# Patient Record
Sex: Male | Born: 1965 | Race: White | Hispanic: No | Marital: Married | Smoking: Former smoker
Health system: Southern US, Community
[De-identification: ages and names within clinical notes are randomized; demographics above are authoritative.]

## PROBLEM LIST (undated history)

## (undated) DIAGNOSIS — M1990 Unspecified osteoarthritis, unspecified site: Secondary | ICD-10-CM

## (undated) DIAGNOSIS — R011 Cardiac murmur, unspecified: Secondary | ICD-10-CM

## (undated) DIAGNOSIS — F419 Anxiety disorder, unspecified: Secondary | ICD-10-CM

## (undated) DIAGNOSIS — Z973 Presence of spectacles and contact lenses: Secondary | ICD-10-CM

## (undated) DIAGNOSIS — R12 Heartburn: Secondary | ICD-10-CM

## (undated) DIAGNOSIS — K219 Gastro-esophageal reflux disease without esophagitis: Secondary | ICD-10-CM

## (undated) DIAGNOSIS — E785 Hyperlipidemia, unspecified: Secondary | ICD-10-CM

## (undated) HISTORY — PX: COLONOSCOPY: SHX174

## (undated) HISTORY — PX: CHOLECYSTECTOMY: SHX55

## (undated) HISTORY — PX: NASAL SINUS SURGERY: SHX719

## (undated) HISTORY — DX: Hyperlipidemia, unspecified: E78.5

## (undated) HISTORY — DX: Anxiety disorder, unspecified: F41.9

---

## 2014-05-09 LAB — LIPID PANEL
Cholesterol: 245 mg/dL — AB (ref 0–200)
HDL: 49 mg/dL (ref 35–70)
LDL CALC: 152 mg/dL

## 2014-05-09 LAB — PSA: PSA: 1

## 2014-06-08 ENCOUNTER — Ambulatory Visit: Payer: Self-pay | Admitting: Gastroenterology

## 2014-06-11 LAB — HM COLONOSCOPY: HM Colonoscopy: ABNORMAL — AB

## 2014-06-20 ENCOUNTER — Ambulatory Visit: Payer: Self-pay | Admitting: Internal Medicine

## 2014-06-25 ENCOUNTER — Ambulatory Visit: Payer: Self-pay | Admitting: Gastroenterology

## 2014-07-10 ENCOUNTER — Ambulatory Visit
Admit: 2014-07-10 | Disposition: A | Discharge status: Home / Self Care | Payer: Self-pay | Attending: Internal Medicine | Admitting: Internal Medicine

## 2014-07-22 DIAGNOSIS — Z85048 Personal history of other malignant neoplasm of rectum, rectosigmoid junction, and anus: Secondary | ICD-10-CM | POA: Insufficient documentation

## 2014-08-03 ENCOUNTER — Ambulatory Visit: Payer: Self-pay

## 2015-01-17 ENCOUNTER — Telehealth: Payer: Self-pay

## 2015-01-17 NOTE — Telephone Encounter (Signed)
  Oncology Nurse Navigator Documentation    Navigator Encounter Type: Telephone (01/17/15 1600)                      Time Spent with Patient: 15 (01/17/15 1600)   Received call from pt. Having follow up at [facility] with Dr Ronita Hipps next wee. Asking if anything at that visit can be done local. Unknown what will be done at that visit. Instructed if he can be made aware we could see if anything could be done local.

## 2015-03-28 ENCOUNTER — Encounter: Payer: Self-pay | Admitting: Family Medicine

## 2015-03-28 ENCOUNTER — Ambulatory Visit (INDEPENDENT_AMBULATORY_CARE_PROVIDER_SITE_OTHER): Payer: PRIVATE HEALTH INSURANCE | Admitting: Family Medicine

## 2015-03-28 VITALS — BP 112/80 | HR 82 | Temp 98.0°F | Resp 18 | Ht 68.0 in | Wt 178.0 lb

## 2015-03-28 DIAGNOSIS — F411 Generalized anxiety disorder: Secondary | ICD-10-CM | POA: Diagnosis not present

## 2015-03-28 DIAGNOSIS — E785 Hyperlipidemia, unspecified: Secondary | ICD-10-CM | POA: Diagnosis not present

## 2015-03-28 MED ORDER — CLONAZEPAM 0.5 MG PO TABS: 0.5000 mg | ORAL_TABLET | Freq: Three times a day (TID) | ORAL | Status: DC

## 2015-03-28 NOTE — Progress Notes (Signed)
Name: Brett Gonzales   MRN: [id number]    DOB: 1965/07/01   Date:03/28/2015       Progress Note  Subjective  Chief Complaint  Chief Complaint  Patient presents with  . Anxiety    3 month follow up    HPI  Anxiety history of present illness  Long-standing history of anxiety. Skin on for many years. He's been seen by psychologist in conjunction past. Is currently on clonazepam 0.5 mg 3 times a day and daily at bedtime which is working effectively for him. Symptoms consist mainly of racing thoughts sweaty palms palpitations  Hyperlipidemia  Patient has a history of hyperlipidemia for several years.  Current medical regimen consist of diet alone .  Compliance is usually good .  Diet and exercise are currently followed usually regularly .  Risk factors for cardiovascular disease include hyperlipidemia . Marland Kitchen   There have been no side effects from the medication.    Past Medical History  Diagnosis Date  . Anxiety   . Hyperlipidemia     Social History  Substance Use Topics  . Smoking status: Former Research scientist (life sciences)  . Smokeless tobacco: Not on file  . Alcohol Use: 0.0 oz/week    0 Standard drinks or equivalent per week     Comment: occasional     Current outpatient prescriptions:  .  clonazePAM (KLONOPIN) 0.5 MG tablet, , Disp: , Rfl:   Allergies  Allergen Reactions  . Other Hives  . Penicillin G Hives  . Androgens Rash    Tachycardia, hot flashes, flushing  . Nsaids Rash    Bleeding thin blood.    Review of Systems  Constitutional: Negative for fever, chills and weight loss.  HENT: Negative for congestion, hearing loss, sore throat and tinnitus.   Eyes: Negative for blurred vision, double vision and redness.  Respiratory: Negative for cough, hemoptysis and shortness of breath.   Cardiovascular: Negative for chest pain, palpitations, orthopnea, claudication and leg swelling.  Gastrointestinal: Negative for heartburn, nausea, vomiting, diarrhea, constipation and blood in stool.   Genitourinary: Negative for dysuria, urgency, frequency and hematuria.  Musculoskeletal: Negative for myalgias, back pain, joint pain, falls and neck pain.  Skin: Negative for itching.  Neurological: Negative for dizziness, tingling, tremors, focal weakness, seizures, loss of consciousness, weakness and headaches.  Endo/Heme/Allergies: Does not bruise/bleed easily.  Psychiatric/Behavioral: Negative for depression and substance abuse. The patient is nervous/anxious. The patient does not have insomnia.      Objective  Filed Vitals:   03/28/15 0753  BP: 112/80  Pulse: 82  Temp: 98 F (36.7 C)  TempSrc: Oral  Resp: 18  Height: 5\' 8"  (1.727 m)  Weight: 178 lb (80.74 kg)  SpO2: 95%     Physical Exam  Constitutional: He is oriented to person, place, and time and well-developed, well-nourished, and in no distress.  HENT:  Head: Normocephalic.  Eyes: EOM are normal. Pupils are equal, round, and reactive to light.  Neck: Normal range of motion. Neck supple. No thyromegaly present.  Cardiovascular: Normal rate, regular rhythm and normal heart sounds.   No murmur heard. Pulmonary/Chest: Effort normal and breath sounds normal. No respiratory distress. He has no wheezes.  Abdominal: Soft. Bowel sounds are normal.  Musculoskeletal: Normal range of motion. He exhibits no edema.  Lymphadenopathy:    He has no cervical adenopathy.  Neurological: He is alert and oriented to person, place, and time. No cranial nerve deficit. Gait normal. Coordination normal.  Skin: Skin is warm and dry. No  rash noted.  Psychiatric: Judgment normal.  Anxious and slightly loquacious.      Assessment & Plan  1. Generalized anxiety disorder Stable - clonazePAM (KLONOPIN) 0.5 MG tablet; Take 1 tablet (0.5 mg total) by mouth 4 (four) times daily -  with meals and at bedtime.  Dispense: 0.12 tablet; Refill: 2 - Comprehensive Metabolic Panel (CMET) - TSH  2. Hyperlipidemia labs - Lipid  panel

## 2015-04-16 LAB — COMPREHENSIVE METABOLIC PANEL
ALT: 36 IU/L (ref 0–44)
AST: 27 IU/L (ref 0–40)
Albumin/Globulin Ratio: 1.7 (ref 1.1–2.5)
Albumin: 4.7 g/dL (ref 3.5–5.5)
Alkaline Phosphatase: 34 IU/L — ABNORMAL LOW (ref 39–117)
BILIRUBIN TOTAL: 0.5 mg/dL (ref 0.0–1.2)
BUN/Creatinine Ratio: 11 (ref 9–20)
BUN: 10 mg/dL (ref 6–24)
CALCIUM: 9.7 mg/dL (ref 8.7–10.2)
CHLORIDE: 101 mmol/L (ref 97–106)
CO2: 25 mmol/L (ref 18–29)
Creatinine, Ser: 0.88 mg/dL (ref 0.76–1.27)
GFR, EST AFRICAN AMERICAN: 115 mL/min/{1.73_m2} (ref >59)
GFR, EST NON AFRICAN AMERICAN: 99 mL/min/{1.73_m2} (ref >59)
GLUCOSE: 85 mg/dL (ref 65–99)
Globulin, Total: 2.8 g/dL (ref 1.5–4.5)
Potassium: 5 mmol/L (ref 3.5–5.2)
Sodium: 142 mmol/L (ref 136–144)
TOTAL PROTEIN: 7.5 g/dL (ref 6.0–8.5)

## 2015-04-16 LAB — LIPID PANEL
Chol/HDL Ratio: 5.8 ratio units — ABNORMAL HIGH (ref 0.0–5.0)
Cholesterol, Total: 236 mg/dL — ABNORMAL HIGH (ref 100–199)
HDL: 41 mg/dL (ref >39)
LDL Calculated: 135 mg/dL — ABNORMAL HIGH (ref 0–99)
Triglycerides: 302 mg/dL — ABNORMAL HIGH (ref 0–149)
VLDL CHOLESTEROL CAL: 60 mg/dL — AB (ref 5–40)

## 2015-04-16 LAB — TSH: TSH: 2.12 u[IU]/mL (ref 0.450–4.500)

## 2015-04-22 ENCOUNTER — Telehealth: Payer: Self-pay | Admitting: Family Medicine

## 2015-04-22 NOTE — Telephone Encounter (Signed)
Pt would like lab results.  

## 2015-04-23 ENCOUNTER — Telehealth: Payer: Self-pay | Admitting: Family Medicine

## 2015-04-23 MED ORDER — ATORVASTATIN CALCIUM 20 MG PO TABS: 20.0000 mg | ORAL_TABLET | Freq: Every day | ORAL | Status: DC

## 2015-04-23 NOTE — Telephone Encounter (Signed)
Requesting most recent lab work to be faxed to him. Please send to [facility] (F) [phone]

## 2015-04-23 NOTE — Telephone Encounter (Signed)
Spoke with pt and gave results. Printed out copy for pt to pick up.

## 2015-04-24 ENCOUNTER — Telehealth: Payer: Self-pay | Admitting: Emergency Medicine

## 2015-04-24 NOTE — Telephone Encounter (Signed)
Information has been faxed per patient request

## 2015-04-24 NOTE — Telephone Encounter (Signed)
Script updated at pharmacy. Labwork to be picked up

## 2015-05-01 ENCOUNTER — Telehealth: Payer: Self-pay | Admitting: Family Medicine

## 2015-05-01 NOTE — Telephone Encounter (Signed)
Patient stated that he his having a reaction to the Liptor medication.  Patient stated it is causing his muscles to ache and is wanting to know if [facility] [place]

## 2015-05-15 MED ORDER — ROSUVASTATIN CALCIUM 5 MG PO TABS: 5.0000 mg | ORAL_TABLET | Freq: Every day | ORAL | Status: DC

## 2015-05-15 NOTE — Telephone Encounter (Signed)
Script called in for Crestor 5.

## 2015-07-01 ENCOUNTER — Ambulatory Visit: Payer: PRIVATE HEALTH INSURANCE | Admitting: Family Medicine

## 2015-07-10 ENCOUNTER — Telehealth: Payer: Self-pay | Admitting: *Deleted

## 2015-07-10 NOTE — Telephone Encounter (Signed)
I have areminder not in my calendar to call pt. He was seen in 2016 and was sent to [facility] for sec opinion and we got a message back stating that he can come back to cancer center once a year and have labs and CT scan.  [facility] had discussed his case in tumor conference and rec: no reexcision just surveillance through blood work and [place].  If he was interested in coming here or if he had been following up with [facility] just call me and let me know and I will handle it either way pt wanted to do. I also left the message that Dr. Ma Hillock was no longer here at clinic but would be glad to set him up with another doctor and Dr. Rogue Bussing is glad to assist.

## 2015-07-17 ENCOUNTER — Telehealth: Payer: Self-pay | Admitting: *Deleted

## 2015-07-17 NOTE — Telephone Encounter (Signed)
No answer on cell phone. Called again and left message that if pt was interested in coming here for labs and ct scan to f/u with him, or if he was going to [facility] to f/u or he had chose not to do either.  If he could let me know and I could arrange appts for him or enter in a f/u note to make sure I am taking of any needs he may have at cancer center or if services are not need per the pt decision. Asked if he could call me back.

## 2015-07-29 ENCOUNTER — Telehealth: Payer: Self-pay | Admitting: Family Medicine

## 2015-07-29 NOTE — Telephone Encounter (Signed)
Patient is requesting a refill on Clonazepam 0.5 mg ([facility]) and Rosuvastatin 5 mg (this medication needs to be sent to [facility] on [address] because it's cheaper).  Please call patient

## 2015-07-30 ENCOUNTER — Other Ambulatory Visit: Payer: Self-pay | Admitting: Family Medicine

## 2015-07-30 ENCOUNTER — Encounter: Payer: Self-pay | Admitting: *Deleted

## 2015-07-30 DIAGNOSIS — F411 Generalized anxiety disorder: Secondary | ICD-10-CM

## 2015-07-30 MED ORDER — CLONAZEPAM 0.5 MG PO TABS: 0.5000 mg | ORAL_TABLET | Freq: Three times a day (TID) | ORAL | Status: DC | PRN

## 2015-07-30 NOTE — Progress Notes (Signed)
I kept a copy of an email from [employer] navigator in regards to above pt.  The message was from Carlisle Cater NP for [facility] GI surgery.  The message was that pt was seen at [facility] and discussed at tumor board and do not rec: re-excision instead just surveillance.  Dr. Ronita Hipps will see him in June 2016 but pt will needa yearly Ct and CEA.  His last scan was end of feb 2016.  I entered a reminder note in outlook to contact him about if he would like to come here and have ct scan which was rec: by [facility].  I also left on my message that Dr. Ma Hillock had left the practice but there are other doctors here that would be happy to care for him.  I have left 2 messages on his cell phone in which it states that this is [place] leave me your message.  I am filing this email printed off and if pt calls back would be happy to provide service for him.

## 2015-08-01 ENCOUNTER — Ambulatory Visit: Payer: PRIVATE HEALTH INSURANCE | Admitting: Family Medicine

## 2015-08-22 ENCOUNTER — Other Ambulatory Visit: Payer: Self-pay

## 2015-08-22 DIAGNOSIS — F411 Generalized anxiety disorder: Secondary | ICD-10-CM

## 2015-08-22 MED ORDER — CLONAZEPAM 0.5 MG PO TABS: 0.5000 mg | ORAL_TABLET | Freq: Three times a day (TID) | ORAL | Status: DC | PRN

## 2015-08-26 ENCOUNTER — Encounter: Payer: Self-pay | Admitting: Family Medicine

## 2015-08-26 ENCOUNTER — Ambulatory Visit (INDEPENDENT_AMBULATORY_CARE_PROVIDER_SITE_OTHER): Payer: PRIVATE HEALTH INSURANCE | Admitting: Family Medicine

## 2015-08-26 VITALS — BP 122/84 | HR 90 | Temp 98.0°F | Resp 18 | Wt 177.0 lb

## 2015-08-26 DIAGNOSIS — Z8719 Personal history of other diseases of the digestive system: Secondary | ICD-10-CM | POA: Diagnosis not present

## 2015-08-26 DIAGNOSIS — F411 Generalized anxiety disorder: Secondary | ICD-10-CM

## 2015-08-26 DIAGNOSIS — E785 Hyperlipidemia, unspecified: Secondary | ICD-10-CM

## 2015-08-26 DIAGNOSIS — Z85048 Personal history of other malignant neoplasm of rectum, rectosigmoid junction, and anus: Secondary | ICD-10-CM | POA: Diagnosis not present

## 2015-08-26 MED ORDER — ASPIRIN EC 81 MG PO TBEC: 81.0000 mg | DELAYED_RELEASE_TABLET | Freq: Every day | ORAL | Status: DC

## 2015-08-26 NOTE — Progress Notes (Signed)
Name: Brett Gonzales   MRN: [id number]    DOB: 1965-07-02   Date:08/26/2015       Progress Note  Subjective  Chief Complaint  Chief Complaint  Patient presents with  . Medication Refill    Klonopin 0.5mg   . Immunizations    TDAP    HPI  Hyperlipidemia: he has been out of Crestor, because his PCP has been on sick leave. Last labs in 04/2015, we will recheck labs today before resuming medication. He denies chest pain or decrease in exercise tolerance. He is very active. He is not on aspirin. Discussed cardiovascular protection and he will try  Anxiety Disorder: he has been taking on Klonopin for many years. He states he was started on Ativan by Dr. Janae Bridgeman many years ago. He states he tried many medications without success in the past. Discussed SSRI and Alprazolam XR, but he would like to hold off until Dr. Rutherford Nail comes back.   History of GI bleed: many years ago, he was taking NSAID's daily and was told to stop at this time.   History of Rectal cancer: it was caught early by a colonoscopy, last one in 06/2015 - he is doing well now, repeat in 3 years. He denies change in bowel movement or rectal bleeding.   Patient Active Problem List   Diagnosis Date Noted  . History of GI bleed 08/26/2015  . Anxiety disorder 03/28/2015  . Hyperlipidemia 03/28/2015  . History of rectal cancer 07/22/2014    History reviewed. No pertinent past surgical history.  History reviewed. No pertinent family history.  Social History   Social History  . Marital Status: Married    Spouse Name: N/A  . Number of Children: N/A  . Years of Education: N/A   Occupational History  . Not on file.   Social History Main Topics  . Smoking status: Former Research scientist (life sciences)  . Smokeless tobacco: Not on file  . Alcohol Use: 0.0 oz/week    0 Standard drinks or equivalent per week     Comment: occasional  . Drug Use: No  . Sexual Activity:    Partners: Female   Other Topics Concern  . Not on file   Social  History Narrative     Current outpatient prescriptions:  .  Ascorbic Acid (VITAMIN C PO), Take 2,000 mg by mouth daily., Disp: , Rfl:  .  b complex vitamins capsule, Take 1 capsule by mouth daily., Disp: , Rfl:  .  clonazePAM (KLONOPIN) 0.5 MG tablet, Take 1 tablet (0.5 mg total) by mouth 3 (three) times daily as needed for anxiety. Patient taking 4 times a day. Verified with pharmacy TID and 1 at bedtime, Disp: 120 tablet, Rfl: 0 .  co-enzyme Q-10 30 MG capsule, Take 30 mg by mouth 3 (three) times daily., Disp: , Rfl:  .  milk thistle 175 MG tablet, Take 175 mg by mouth daily., Disp: , Rfl:  .  Misc Natural Products (GINSENG COMPLEX PO), Take by mouth., Disp: , Rfl:  .  Multiple Vitamin (MULTI-VITAMINS) TABS, Take by mouth., Disp: , Rfl:   Allergies  Allergen Reactions  . Clarithromycin Hives  . Other Hives  . Penicillin G Hives  . Testosterone Other (See Comments)    Tachycardia, hot flashes, flushing  . Androgens Rash    Tachycardia, hot flashes, flushing  . Nsaids Rash    Bleeding thin blood.     ROS  Ten systems reviewed and is negative except as mentioned in HPI  Objective  Filed Vitals:   08/26/15 0855  BP: 122/84  Pulse: 90  Temp: 98 F (36.7 C)  TempSrc: Oral  Resp: 18  Weight: 177 lb (80.287 kg)  SpO2: 97%    Body mass index is 26.92 kg/(m^2).  Physical Exam  Constitutional: Patient appears well-developed and well-nourished.  No distress.  HEENT: head atraumatic, normocephalic, pupils equal and reactive to light, neck supple, throat within normal limits Cardiovascular: Normal rate, regular rhythm and normal heart sounds.  No murmur heard. No BLE edema. Pulmonary/Chest: Effort normal and breath sounds normal. No respiratory distress. Abdominal: Soft.  There is no tenderness. Psychiatric: Patient has a normal mood and affect. behavior is normal. Judgment and thought content normal.  PHQ2/9: Depression screen [facility] 2/9 08/26/2015 03/28/2015  Decreased  Interest 0 0  Down, Depressed, Hopeless 0 0  PHQ - 2 Score 0 0    Fall Risk: Fall Risk  08/26/2015 03/28/2015  Falls in the past year? No No    Functional Status Survey: Is the patient deaf or have difficulty hearing?: No Does the patient have difficulty seeing, even when wearing glasses/contacts?: No Does the patient have difficulty concentrating, remembering, or making decisions?: No Does the patient have difficulty walking or climbing stairs?: No Does the patient have difficulty dressing or bathing?: No Does the patient have difficulty doing errands alone such as visiting a doctor's office or shopping?: No    Assessment & Plan  1. Hyperlipidemia  - Lipid panel  2. History of rectal cancer  Doing well, last colonoscopy was clear  3. Generalized anxiety disorder  Discussed options, he will think about it and discuss with Dr. Rutherford Nail  4. History of GI bleed  Discussed need for baby aspirin and he will try. Advised to stop if he has any GI discomfort or bleed, can even try taking every other day to begin

## 2015-09-10 LAB — LIPID PANEL
CHOL/HDL RATIO: 5.7 ratio — AB (ref 0.0–5.0)
Cholesterol, Total: 212 mg/dL — ABNORMAL HIGH (ref 100–199)
HDL: 37 mg/dL — ABNORMAL LOW (ref >39)
LDL CALC: 125 mg/dL — AB (ref 0–99)
Triglycerides: 251 mg/dL — ABNORMAL HIGH (ref 0–149)
VLDL Cholesterol Cal: 50 mg/dL — ABNORMAL HIGH (ref 5–40)

## 2015-09-17 ENCOUNTER — Ambulatory Visit: Payer: PRIVATE HEALTH INSURANCE | Admitting: Family Medicine

## 2015-09-23 ENCOUNTER — Other Ambulatory Visit: Payer: Self-pay | Admitting: Family Medicine

## 2015-09-26 ENCOUNTER — Ambulatory Visit (INDEPENDENT_AMBULATORY_CARE_PROVIDER_SITE_OTHER): Payer: PRIVATE HEALTH INSURANCE | Admitting: Family Medicine

## 2015-09-26 ENCOUNTER — Encounter: Payer: Self-pay | Admitting: Family Medicine

## 2015-09-26 VITALS — BP 116/76 | HR 89 | Temp 97.6°F | Resp 16 | Wt 169.9 lb

## 2015-09-26 DIAGNOSIS — Z85048 Personal history of other malignant neoplasm of rectum, rectosigmoid junction, and anus: Secondary | ICD-10-CM | POA: Diagnosis not present

## 2015-09-26 DIAGNOSIS — F411 Generalized anxiety disorder: Secondary | ICD-10-CM

## 2015-09-26 DIAGNOSIS — E785 Hyperlipidemia, unspecified: Secondary | ICD-10-CM

## 2015-09-26 DIAGNOSIS — R634 Abnormal weight loss: Secondary | ICD-10-CM

## 2015-09-26 MED ORDER — ASPIRIN 81 MG PO TBEC: 81.0000 mg | DELAYED_RELEASE_TABLET | Freq: Every day | ORAL | Status: DC

## 2015-09-26 MED ORDER — CLONAZEPAM 0.5 MG PO TABS: 0.5000 mg | ORAL_TABLET | Freq: Every day | ORAL | Status: DC | PRN

## 2015-09-26 MED ORDER — ALPRAZOLAM ER 0.5 MG PO TB24: 0.5000 mg | ORAL_TABLET | Freq: Every day | ORAL | Status: DC

## 2015-09-26 NOTE — Progress Notes (Signed)
Name: Brett Gonzales   MRN: [id number]    DOB: 02/22/1966   Date:09/26/2015       Progress Note  Subjective  Chief Complaint  Chief Complaint  Patient presents with  . Medication Refill    clonazepam    HPI  Hyperlipidemia: he has been out of Crestor, because his PCP has been on sick leave. Last labs in 04/2015, we rechecked labs and ASCVD was not high so we will stay off statin therapy. Based on the results of lipid panel his cardiovascular risk factor ( using [facility] )  in the next 10 years is : 5.2 % and does not need statin therapy at this time.Marland Kitchen He denies chest pain or decrease in exercise tolerance. He is very active.  Anxiety Disorder: he has been taking on Klonopin for many years. He states he was started on Ativan by Dr. Janae Bridgeman many years ago. He states he tried many medications without success in the past. Discussed SSRI and Alprazolam XR. He is back today, Dr. Rutherford Nail will not be back until July ( possibly ) he is willing to try Alprazolam XR and if it does not work we will refer him to another provider that feels comfortable prescribing 120 pills monthly.   History of GI bleed: many years ago, he was taking NSAID's daily and was told to stop at this time.   History of Rectal cancer: it was caught early by a colonoscopy, last one in 06/2015 - he is doing well now, repeat in 3 years. He denies change in bowel movement or rectal bleeding.   Weight loss: he has lost 8 lbs over the past month, he states he is trying to lose weight. He has increased physical activity, has a FitBit. He has changed his diet, eat more fruit and protein, avoid sweets. He states he wants to go down to 165 lbs. Discussed checking labs to make sure no other reason for weight loss, but he would like to hold off for now. He also had a mild gastroenteritis a few weeks ago and lost some weight at the time. He is also taking cambondia garcinia from [facility] and has caused him to have more bowel movements. He  denies blood in stools, no mucus, no nocturnal bowel movements   Patient Active Problem List   Diagnosis Date Noted  . History of GI bleed 08/26/2015  . GAD (generalized anxiety disorder) 03/28/2015  . Hyperlipidemia 03/28/2015  . History of rectal cancer 07/22/2014    Past Surgical History  Procedure Laterality Date  . Cholecystectomy    . Nasal sinus surgery Bilateral     Family History  Problem Relation Age of Onset  . Cancer Mother   . Diabetes Father     Social History   Social History  . Marital Status: Married    Spouse Name: N/A  . Number of Children: N/A  . Years of Education: N/A   Occupational History  . Not on file.   Social History Main Topics  . Smoking status: Former Research scientist (life sciences)  . Smokeless tobacco: Not on file  . Alcohol Use: 0.0 oz/week    0 Standard drinks or equivalent per week     Comment: occasional  . Drug Use: No  . Sexual Activity:    Partners: Female   Other Topics Concern  . Not on file   Social History Narrative     Current outpatient prescriptions:  .  ALPRAZolam (ALPRAZOLAM XR) 0.5 MG 24 hr tablet, Take 1  tablet (0.5 mg total) by mouth daily., Disp: 30 tablet, Rfl: 0 .  Ascorbic Acid (VITAMIN C PO), Take 2,000 mg by mouth daily., Disp: , Rfl:  .  aspirin 81 MG EC tablet, TAKE 1 TABLET BY MOUTH EVERY DAY, Disp: 30 tablet, Rfl: 0 .  b complex vitamins capsule, Take 1 capsule by mouth daily., Disp: , Rfl:  .  clonazePAM (KLONOPIN) 0.5 MG tablet, Take 1 tablet (0.5 mg total) by mouth daily as needed for anxiety. Patient taking 4 times a day. Verified with pharmacy TID and 1 at bedtime, Disp: 30 tablet, Rfl: 0 .  co-enzyme Q-10 30 MG capsule, Take 30 mg by mouth 3 (three) times daily., Disp: , Rfl:  .  milk thistle 175 MG tablet, Take 175 mg by mouth daily., Disp: , Rfl:  .  Misc Natural Products (GINSENG COMPLEX PO), Take by mouth., Disp: , Rfl:  .  Multiple Vitamin (MULTI-VITAMINS) TABS, Take by mouth., Disp: , Rfl:   Allergies   Allergen Reactions  . Clarithromycin Hives  . Other Hives  . Penicillin G Hives  . Testosterone Other (See Comments)    Tachycardia, hot flashes, flushing  . Androgens Rash    Tachycardia, hot flashes, flushing  . Nsaids Other (See Comments)    History of GI bleed      ROS  Constitutional: Negative for fever , positive for  weight change.  Respiratory: Negative for cough and shortness of breath.   Cardiovascular: Negative for chest pain or palpitations.  Gastrointestinal: Negative for abdominal pain, no bowel changes.  Musculoskeletal: Negative for gait problem or joint swelling.  Skin: Negative for rash.  Neurological: Negative for dizziness or headache.  No other specific complaints in a complete review of systems (except as listed in HPI above).  Objective  Filed Vitals:   09/26/15 0837  BP: 116/76  Pulse: 89  Temp: 97.6 F (36.4 C)  TempSrc: Oral  Resp: 16  Weight: 169 lb 14.4 oz (77.066 kg)  SpO2: 98%    Body mass index is 25.84 kg/(m^2).  Physical Exam  Constitutional: Patient appears well-developed and well-nourished. No distress.  HEENT: head atraumatic, normocephalic, pupils equal and reactive to light,  neck supple, throat within normal limits Cardiovascular: Normal rate, regular rhythm and normal heart sounds.  No murmur heard. No BLE edema. Pulmonary/Chest: Effort normal and breath sounds normal. No respiratory distress. Abdominal: Soft.  There is no tenderness. Psychiatric: Patient has a normal mood and affect. behavior is normal. Judgment and thought content normal.  Recent Results (from the past 2160 hour(s))  Lipid panel     Status: Abnormal   Collection Time: 09/09/15  8:19 AM  Result Value Ref Range   Cholesterol, Total 212 (H) 100 - 199 mg/dL   Triglycerides 251 (H) 0 - 149 mg/dL   HDL 37 (L) >39 mg/dL   VLDL Cholesterol Cal 50 (H) 5 - 40 mg/dL   LDL Calculated 125 (H) 0 - 99 mg/dL   Chol/HDL Ratio 5.7 (H) 0.0 - 5.0 ratio units     Comment:                                   T. Chol/HDL Ratio  Men  Women                               1/2 Avg.Risk  3.4    3.3                                   Avg.Risk  5.0    4.4                                2X Avg.Risk  9.6    7.1                                3X Avg.Risk 23.4   11.0       PHQ2/9: Depression screen [facility] 2/9 09/26/2015 08/26/2015 03/28/2015  Decreased Interest 0 0 0  Down, Depressed, Hopeless 0 0 0  PHQ - 2 Score 0 0 0     Fall Risk: Fall Risk  09/26/2015 08/26/2015 03/28/2015  Falls in the past year? No No No     Functional Status Survey: Is the patient deaf or have difficulty hearing?: No Does the patient have difficulty seeing, even when wearing glasses/contacts?: No Does the patient have difficulty concentrating, remembering, or making decisions?: No Does the patient have difficulty walking or climbing stairs?: No Does the patient have difficulty dressing or bathing?: No Does the patient have difficulty doing errands alone such as visiting a doctor's office or shopping?: No    Assessment & Plan  1. Generalized anxiety disorder  - clonazePAM (KLONOPIN) 0.5 MG tablet; Take 1 tablet (0.5 mg total) by mouth daily as needed for anxiety. Patient taking 4 times a day. Verified with pharmacy TID and 1 at bedtime  Dispense: 30 tablet; Refill: 0 - ALPRAZolam (ALPRAZOLAM XR) 0.5 MG 24 hr tablet; Take 1 tablet (0.5 mg total) by mouth daily.  Dispense: 30 tablet; Refill: 0  2. Dyslipidemia  Lipid panel shows low HDL : to improve HDL patient  needs to eat tree nuts ( pecans/pistachios/almonds ) four times weekly, eat fish two times weekly  and exercise  at least 150 minutes per week  3. History of rectal cancer  Up to date with CEA and colonoscopy   4. Weight loss  Discussed getting labs but he wants to hold off, he will return sooner if drops below 165 lbs

## 2015-10-23 ENCOUNTER — Other Ambulatory Visit: Payer: Self-pay | Admitting: Family Medicine

## 2015-10-23 DIAGNOSIS — F411 Generalized anxiety disorder: Secondary | ICD-10-CM

## 2015-10-23 NOTE — Telephone Encounter (Signed)
Refill request was sent to Dr. Krichna Sowles for approval and submission.  

## 2015-10-23 NOTE — Telephone Encounter (Signed)
PT SAID THAT HE HAS AN APPT IN FOLLOW UP IS IN SEPT WITH THE DR AND SHE PLACED HIM ON A NEW ANXIETY MEDICATION BUT SHE GAVE HIM 30 DAYS TO TRY . HE IS NEEDING REFILL ON BOTH CLONZAPAM, AND ALPRAZALAM FOR SHE CHANGED THE DOSE ON THE CLONZAPAM 120 TO 30. [facility] [place]

## 2015-10-24 ENCOUNTER — Telehealth: Payer: Self-pay

## 2015-10-24 MED ORDER — CLONAZEPAM 0.5 MG PO TABS: 0.5000 mg | ORAL_TABLET | Freq: Every day | ORAL | Status: DC | PRN

## 2015-10-24 MED ORDER — ALPRAZOLAM ER 0.5 MG PO TB24: 0.5000 mg | ORAL_TABLET | Freq: Every day | ORAL | Status: DC

## 2015-10-24 NOTE — Telephone Encounter (Signed)
I am going to refill Alprazolam XR to take one daily but should only take Klonopin prn now, and it must last 90 days. Please make sure patient understands. The Xanax XR should be in his system all day

## 2015-10-24 NOTE — Telephone Encounter (Signed)
Patient called wanting to know about his earlier message.  Ilsa Iha at 10/23/2015 11:38 AM  PT SAID THAT HE HAS AN APPT IN FOLLOW UP IS IN SEPT WITH THE DR AND SHE PLACED HIM ON A NEW ANXIETY MEDICATION BUT SHE GAVE HIM 30 DAYS TO TRY . HE IS NEEDING REFILL ON BOTH CLONZAPAM, AND ALPRAZALAM FOR SHE CHANGED THE DOSE ON THE CLONZAPAM 120 TO 30. [facility] [place]

## 2015-11-24 ENCOUNTER — Other Ambulatory Visit: Payer: Self-pay | Admitting: Family Medicine

## 2016-01-02 ENCOUNTER — Ambulatory Visit (INDEPENDENT_AMBULATORY_CARE_PROVIDER_SITE_OTHER): Payer: PRIVATE HEALTH INSURANCE | Admitting: Family Medicine

## 2016-01-02 ENCOUNTER — Encounter: Payer: Self-pay | Admitting: Family Medicine

## 2016-01-02 VITALS — BP 126/78 | HR 77 | Temp 97.8°F | Resp 18 | Wt 176.0 lb

## 2016-01-02 DIAGNOSIS — Z85048 Personal history of other malignant neoplasm of rectum, rectosigmoid junction, and anus: Secondary | ICD-10-CM | POA: Diagnosis not present

## 2016-01-02 DIAGNOSIS — F411 Generalized anxiety disorder: Secondary | ICD-10-CM | POA: Diagnosis not present

## 2016-01-02 DIAGNOSIS — E785 Hyperlipidemia, unspecified: Secondary | ICD-10-CM | POA: Diagnosis not present

## 2016-01-02 MED ORDER — ALPRAZOLAM ER 0.5 MG PO TB24: 0.5000 mg | ORAL_TABLET | Freq: Every day | ORAL | 2 refills | Status: DC

## 2016-01-02 MED ORDER — CLONAZEPAM 0.5 MG PO TABS: 0.5000 mg | ORAL_TABLET | Freq: Every day | ORAL | 0 refills | Status: DC | PRN

## 2016-01-02 NOTE — Progress Notes (Addendum)
Name: Brett Gonzales   MRN: [id number]    DOB: [birth year]   Date:01/02/2016       Progress Note  Subjective  Chief Complaint  Chief Complaint  Patient presents with  . Medication Refill    3 month F/U    HPI  Hyperlipidemia:  Based on the results of lipid panel his cardiovascular risk factor ( using [facility] )  in the next 10 years is : 5.2 % and does not need statin therapy at this time.Marland Kitchen He denies chest pain or decrease in exercise tolerance. He is very active, he has a physical job and likes to work out ( rides bikes )  Anxiety Disorder: he has been taking on Klonopin for many years. He states he was started on Ativan by Dr. Janae Bridgeman many years ago. He states he tried many medications without success in the past. Discussed SSRI and Alprazolam XR. We switched him from Klonopin 0.5 mg four times daily to Alprazolam XR 0.5 mg daily and Klonopin 0.5 prn panic attack ( 30 pills to last 90 days ). He states he feels like medication wears off in the mid to late afternoon, and has to step out to take some deep breaths to relax, no full blow panic attacks. He thought the 30 pills of Klonopin was per month and ran out of medication within the first 30 days, he has learned how to cope with deep breathing exercises  History of GI bleed: many years ago, he was taking NSAID's daily and was advised to switch to Tylenol and he has been doing that for his joints.   History of Rectal cancer: it was caught early by a colonoscopy, last one in 06/2015 - he is doing well now, repeat in 3 years. He denies change in bowel movement or rectal bleeding.   Weight loss: he had lost 8 lbs in one month prior to his last visit, but gained 10 lbs since. He had lost weight because of a GI bug, feeling well now.    Patient Active Problem List   Diagnosis Date Noted  . History of GI bleed 08/26/2015  . GAD (generalized anxiety disorder) 03/28/2015  . Hyperlipidemia 03/28/2015  . History of rectal cancer  07/22/2014    Past Surgical History:  Procedure Laterality Date  . CHOLECYSTECTOMY    . NASAL SINUS SURGERY Bilateral     Family History  Problem Relation Age of Onset  . Cancer Mother     Cervical  . Diabetes Father     Social History   Social History  . Marital status: Married    Spouse name: N/A  . Number of children: N/A  . Years of education: N/A   Occupational History  . Not on file.   Social History Main Topics  . Smoking status: Former Research scientist (life sciences)  . Smokeless tobacco: Never Used  . Alcohol use 0.0 oz/week     Comment: occasional  . Drug use: No  . Sexual activity: Yes    Partners: Female   Other Topics Concern  . Not on file   Social History Narrative  . No narrative on file     Current Outpatient Prescriptions:  .  acetaminophen (TYLENOL) 650 MG CR tablet, Take 1 tablet by mouth daily., Disp: , Rfl:  .  ALPRAZolam (ALPRAZOLAM XR) 0.5 MG 24 hr tablet, Take 1 tablet (0.5 mg total) by mouth daily., Disp: 30 tablet, Rfl: 2 .  Ascorbic Acid (VITAMIN C PO), Take 2,000 mg by mouth  daily., Disp: , Rfl:  .  aspirin 81 MG EC tablet, Take 1 tablet (81 mg total) by mouth daily. Swallow whole., Disp: 30 tablet, Rfl: 12 .  b complex vitamins capsule, Take 1 capsule by mouth daily., Disp: , Rfl:  .  clonazePAM (KLONOPIN) 0.5 MG tablet, Take 1 tablet (0.5 mg total) by mouth daily as needed for anxiety. Take prn only , 30 to last 90 days, Disp: 30 tablet, Rfl: 0 .  co-enzyme Q-10 30 MG capsule, Take 30 mg by mouth 3 (three) times daily., Disp: , Rfl:  .  milk thistle 175 MG tablet, Take 175 mg by mouth daily., Disp: , Rfl:  .  Misc Natural Products (GINSENG COMPLEX PO), Take by mouth., Disp: , Rfl:  .  Multiple Vitamin (MULTI-VITAMINS) TABS, Take by mouth., Disp: , Rfl:   Allergies  Allergen Reactions  . Clarithromycin Hives  . Other Hives  . Penicillin G Hives  . Testosterone Other (See Comments)    Tachycardia, hot flashes, flushing  . Androgens Rash     Tachycardia, hot flashes, flushing  . Nsaids Other (See Comments)    History of GI bleed      ROS  Constitutional: Negative for fever or weight change.  Respiratory: Negative for cough and shortness of breath.   Cardiovascular: Negative for chest pain or palpitations.  Gastrointestinal: Negative for abdominal pain, no bowel changes.  Musculoskeletal: Negative for gait problem or joint swelling.  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  Neurological: Negative for dizziness or headache.  No other specific complaints in a complete review of systems (except as listed in HPI above).   Objective  Vitals:   01/02/16 0801  BP: 126/78  Pulse: 77  Resp: 18  Temp: 97.8 F (36.6 C)  TempSrc: Oral  SpO2: 96%  Weight: 176 lb (79.8 kg)    Body mass index is 26.76 kg/m.  Physical Exam  Constitutional: Patient appears well-developed and well-nourished.  No distress.  HEENT: head atraumatic, normocephalic, pupils equal and reactive to light,  neck supple, throat within normal limits Cardiovascular: Normal rate, regular rhythm and normal heart sounds.  No murmur heard. No BLE edema. Pulmonary/Chest: Effort normal and breath sounds normal. No respiratory distress. Abdominal: Soft.  There is no tenderness. Psychiatric: Patient has a normal mood and affect. behavior is normal. Judgment and thought content normal.  PHQ2/9: Depression screen [facility] 2/9 09/26/2015 08/26/2015 03/28/2015  Decreased Interest 0 0 0  Down, Depressed, Hopeless 0 0 0  PHQ - 2 Score 0 0 0    Fall Risk: Fall Risk  09/26/2015 08/26/2015 03/28/2015  Falls in the past year? No No No    GAD 7 : Generalized Anxiety Score 01/02/2016  Nervous, Anxious, on Edge 2  Control/stop worrying 1  Worry too much - different things 1  Trouble relaxing 0  Restless 0  Easily annoyed or irritable 0  Afraid - awful might happen 1  Total GAD 7 Score 5  Anxiety Difficulty Somewhat difficult      Assessment & Plan  1. Generalized anxiety disorder  - ALPRAZolam (ALPRAZOLAM XR) 0.5 MG 24 hr tablet; Take 1 tablet (0.5 mg total) by mouth daily.  Dispense: 30 tablet; Refill: 2 - clonazePAM (KLONOPIN) 0.5 MG tablet; Take 1 tablet (0.5 mg total) by mouth daily as needed for anxiety. Take prn only , 30 to last 90 days  Dispense: 30 tablet; Refill: 0  2. Dyslipidemia  He wants to continue  life style modification at this time  3. History of rectal cancer  Doing well now, no change in bowel movements

## 2016-03-20 ENCOUNTER — Encounter: Payer: Self-pay | Admitting: Family Medicine

## 2016-03-20 ENCOUNTER — Ambulatory Visit (INDEPENDENT_AMBULATORY_CARE_PROVIDER_SITE_OTHER): Payer: PRIVATE HEALTH INSURANCE | Admitting: Family Medicine

## 2016-03-20 VITALS — BP 128/84 | HR 87 | Temp 98.2°F | Resp 18 | Ht 68.0 in | Wt 177.2 lb

## 2016-03-20 DIAGNOSIS — Z23 Encounter for immunization: Secondary | ICD-10-CM | POA: Diagnosis not present

## 2016-03-20 DIAGNOSIS — F411 Generalized anxiety disorder: Secondary | ICD-10-CM

## 2016-03-20 DIAGNOSIS — Z85048 Personal history of other malignant neoplasm of rectum, rectosigmoid junction, and anus: Secondary | ICD-10-CM

## 2016-03-20 DIAGNOSIS — E785 Hyperlipidemia, unspecified: Secondary | ICD-10-CM

## 2016-03-20 DIAGNOSIS — K644 Residual hemorrhoidal skin tags: Secondary | ICD-10-CM | POA: Insufficient documentation

## 2016-03-20 MED ORDER — ALPRAZOLAM ER 0.5 MG PO TB24: 0.5000 mg | ORAL_TABLET | Freq: Every day | ORAL | 2 refills | Status: DC

## 2016-03-20 MED ORDER — CLONAZEPAM 0.5 MG PO TABS: 0.5000 mg | ORAL_TABLET | Freq: Every day | ORAL | 0 refills | Status: DC | PRN

## 2016-03-20 NOTE — Progress Notes (Signed)
Name: Brett Gonzales   MRN: [id number]    DOB: [birth year]   Date:03/20/2016       Progress Note  Subjective  Chief Complaint  Chief Complaint  Patient presents with  . Anxiety    3 month follow up, medication refills    HPI  Hyperlipidemia:  Based on the results of lipid panel his cardiovascular risk factor ( using [facility] ) in the next 10 years is 5.2 % and does not need statin therapy at this time He denies chest pain or decrease in exercise tolerance. He is very active, he has a physical job and likes to work out ( rides bikes )  Anxiety Disorder: he has been taking on Klonopin for many years. He states he was started on Ativan by Dr. Janae Bridgeman many years ago. He states he tried many medications without success in the past. Discussed SSRI and Alprazolam XR. We switched him from Klonopin 0.5 mg four times daily to Alprazolam XR 0.5 mg daily and Klonopin 0.5 prn panic attack ( 30 pills to last 90 days ). He states he feels like medication wears off in the mid to late afternoon, and has to step out to take some deep breaths to relax, he denies any full blown panic attacks, he states he has been under a lot of stress lately, father-in-law was very sick and died in Mar 17, 2023 and he got wrote up at work shortly after the day after he went back from bereavement period. Currently his mother-in-law is living with them, however she is not a source of stress. He states his bp at home has been elevated. Discussed Atenolol but he states stress is going down again and he does not want to start any other medications at this time. He will bring the bp machine from home to see if the reading is accurate   History of Rectal cancer: it was caught early by a colonoscopy, last one in 06/2015 - he is doing well now, weight has stabilized,  repeat in 3 years. He denies change in bowel movement or rectal bleeding.   Patient Active Problem List   Diagnosis Date Noted  . History of GI bleed 08/26/2015  . GAD  (generalized anxiety disorder) 03/28/2015  . Hyperlipidemia 03/28/2015  . History of rectal cancer 07/22/2014    Past Surgical History:  Procedure Laterality Date  . CHOLECYSTECTOMY    . NASAL SINUS SURGERY Bilateral     Family History  Problem Relation Age of Onset  . Cancer Mother     Cervical  . Diabetes Father     Social History   Social History  . Marital status: Married    Spouse name: N/A  . Number of children: N/A  . Years of education: N/A   Occupational History  . Not on file.   Social History Main Topics  . Smoking status: Former Research scientist (life sciences)  . Smokeless tobacco: Never Used  . Alcohol use 0.0 oz/week     Comment: occasional  . Drug use: No  . Sexual activity: Yes    Partners: Female   Other Topics Concern  . Not on file   Social History Narrative  . No narrative on file     Current Outpatient Prescriptions:  .  acetaminophen (TYLENOL) 650 MG CR tablet, Take 1 tablet by mouth daily., Disp: , Rfl:  .  ALPRAZolam (ALPRAZOLAM XR) 0.5 MG 24 hr tablet, Take 1 tablet (0.5 mg total) by mouth daily., Disp: 30 tablet, Rfl: 2 .  Ascorbic Acid (VITAMIN C PO), Take 2,000 mg by mouth daily., Disp: , Rfl:  .  aspirin 81 MG EC tablet, Take 1 tablet (81 mg total) by mouth daily. Swallow whole., Disp: 30 tablet, Rfl: 12 .  b complex vitamins capsule, Take 1 capsule by mouth daily., Disp: , Rfl:  .  clonazePAM (KLONOPIN) 0.5 MG tablet, Take 1 tablet (0.5 mg total) by mouth daily as needed for anxiety. Take prn only , 30 to last 90 days, Disp: 30 tablet, Rfl: 0 .  co-enzyme Q-10 30 MG capsule, Take 30 mg by mouth 3 (three) times daily., Disp: , Rfl:  .  milk thistle 175 MG tablet, Take 175 mg by mouth daily., Disp: , Rfl:  .  Misc Natural Products (GINSENG COMPLEX PO), Take by mouth., Disp: , Rfl:  .  Multiple Vitamin (MULTI-VITAMINS) TABS, Take by mouth., Disp: , Rfl:   Allergies  Allergen Reactions  . Clarithromycin Hives  . Other Hives  . Penicillin G Hives  .  Testosterone Other (See Comments)    Tachycardia, hot flashes, flushing  . Androgens Rash    Tachycardia, hot flashes, flushing  . Nsaids Other (See Comments)    History of GI bleed      ROS  Constitutional: Negative for fever or weight change.  Respiratory: Negative for cough and shortness of breath.   Cardiovascular: Negative for chest pain or palpitations.  Gastrointestinal: Negative for abdominal pain, no bowel changes.  Musculoskeletal: Negative for gait problem or joint swelling.  Skin: Negative for rash.  Neurological: Negative for dizziness or headache.  No other specific complaints in a complete review of systems (except as listed in HPI above).  Objective  Vitals:   03/20/16 0743  BP: 128/84  Pulse: 87  Resp: 18  Temp: 98.2 F (36.8 C)  TempSrc: Oral  SpO2: 95%  Weight: 177 lb 3.2 oz (80.4 kg)  Height: 5\' 8"  (1.727 m)    Body mass index is 26.94 kg/m.  Physical Exam  Constitutional: Patient appears well-developed and well-nourished.  No distress.  HEENT: head atraumatic, normocephalic, pupils equal and reactive to light, neck supple, throat within normal limits Cardiovascular: Normal rate, regular rhythm and normal heart sounds.  No murmur heard. No BLE edema. Pulmonary/Chest: Effort normal and breath sounds normal. No respiratory distress. Abdominal: Soft.  There is no tenderness. Psychiatric: Patient has a normal mood and affect. behavior is normal. Judgment and thought content normal.  PHQ2/9: Depression screen [facility] 2/9 09/26/2015 08/26/2015 03/28/2015  Decreased Interest 0 0 0  Down, Depressed, Hopeless 0 0 0  PHQ - 2 Score 0 0 0     Fall Risk: Fall Risk  09/26/2015 08/26/2015 03/28/2015  Falls in the past year? No No No     Assessment & Plan  1. Generalized anxiety disorder  - ALPRAZolam (ALPRAZOLAM XR) 0.5 MG 24 hr tablet; Take 1 tablet (0.5 mg total) by mouth daily.  Dispense: 30 tablet; Refill: 2 - Fill Nov 21 st, 2017 - clonazePAM  (KLONOPIN) 0.5 MG tablet; Take 1 tablet (0.5 mg total) by mouth daily as needed for anxiety. Take prn only , 30 to last 90 days  Dispense: 30 tablet; Refill: 0  2. Dyslipidemia  Discussed life style modification

## 2016-03-30 ENCOUNTER — Ambulatory Visit: Payer: PRIVATE HEALTH INSURANCE | Admitting: Family Medicine

## 2016-05-27 ENCOUNTER — Ambulatory Visit: Payer: Self-pay | Admitting: Family Medicine

## 2016-05-31 ENCOUNTER — Encounter: Payer: Self-pay | Admitting: Family Medicine

## 2016-06-21 IMAGING — CT CT CHEST W/ CM
2 of 3 series · 15 of 36 positions shown, 18 images · IV contrast (omnipaque)
Comparison: Abdominal CT 06/22/2014

CLINICAL DATA: Rectal cancer.  Remote history of tobacco use.

EXAM:
CT CHEST WITH CONTRAST
TECHNIQUE: Multidetector CT imaging of the chest was performed during
intravenous contrast administration.
CONTRAST:  75 mL Omnipaque 350

[Series 2: routine chest with · axial · 0.70mm/px · z∈[-606,-326]mm · 12 of 66 slices shown, 15 images]
[im 5/66  mediastinal]
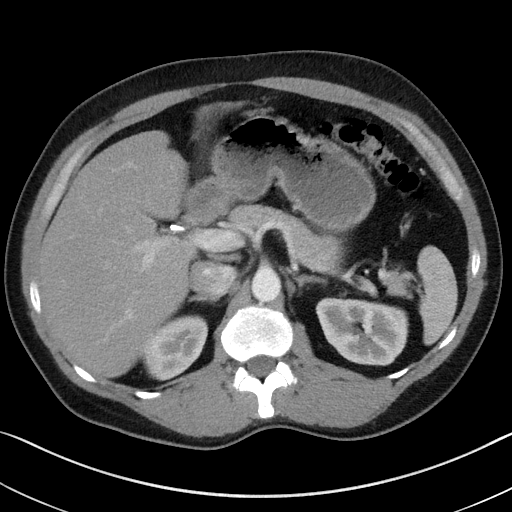
[im 5/66  lung]
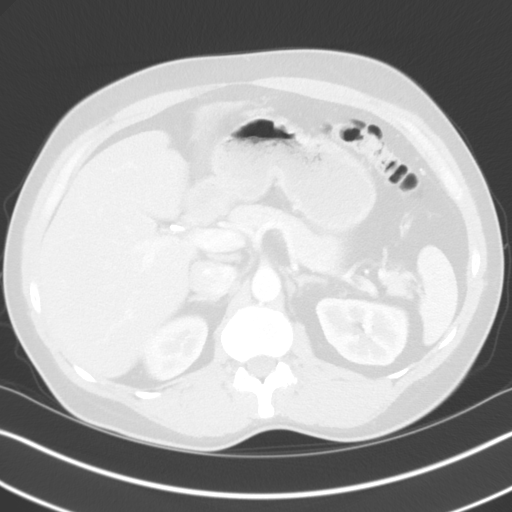
[im 10/66  lung]
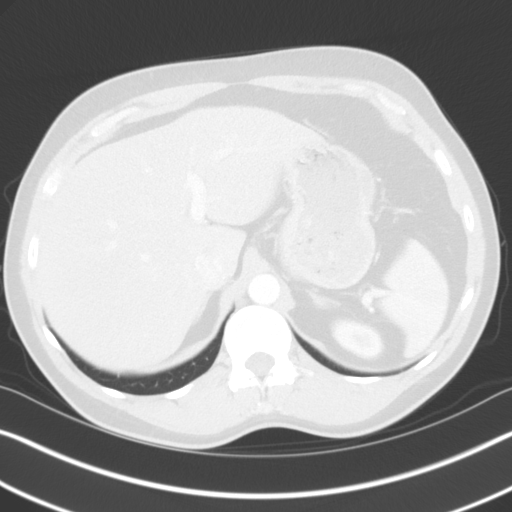
[im 15/66  lung]
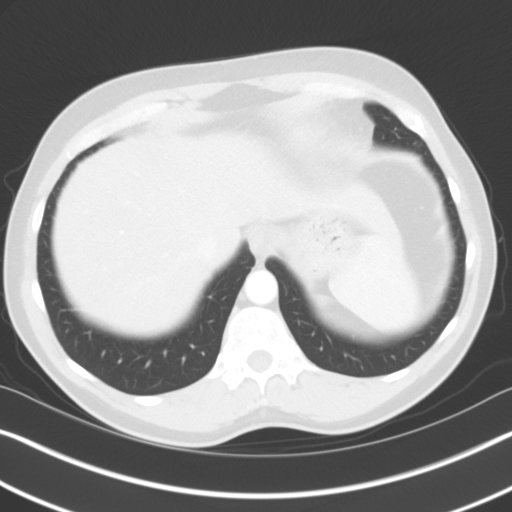
[im 20/66  lung]
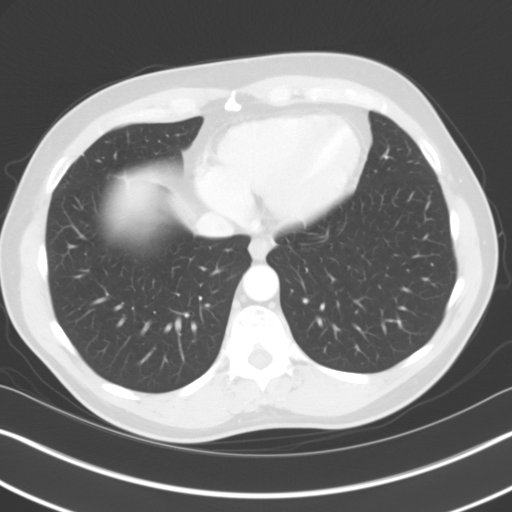
[im 25/66  mediastinal]
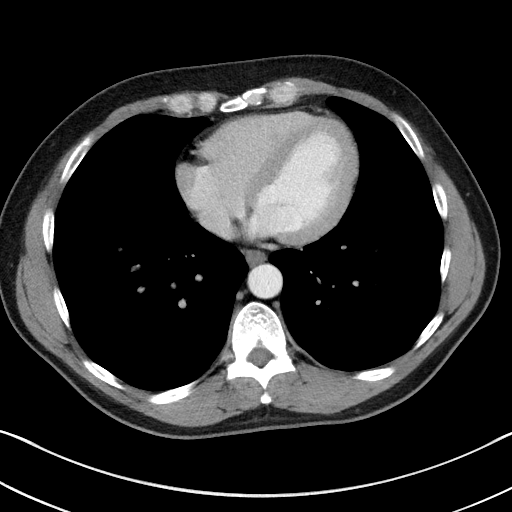
[im 25/66  lung]
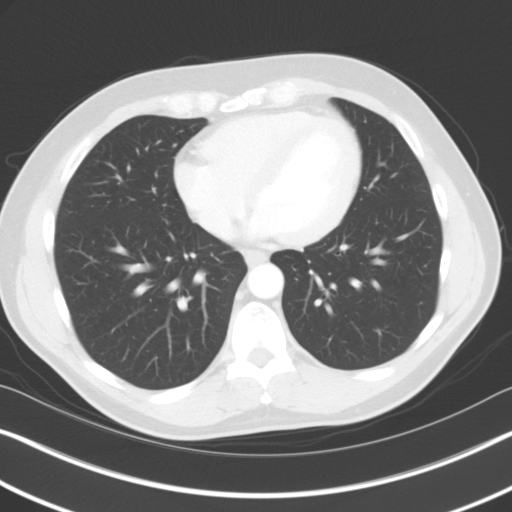
[im 29/66  lung]
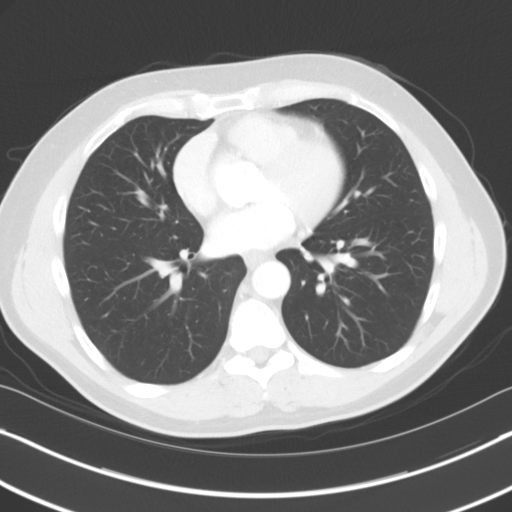
[im 37/66  lung]
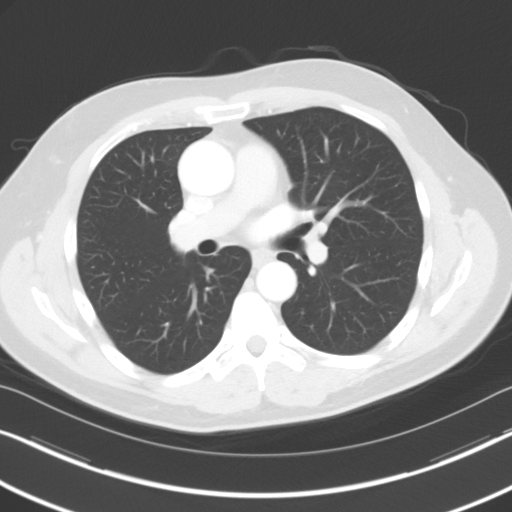
[im 41/66  lung]
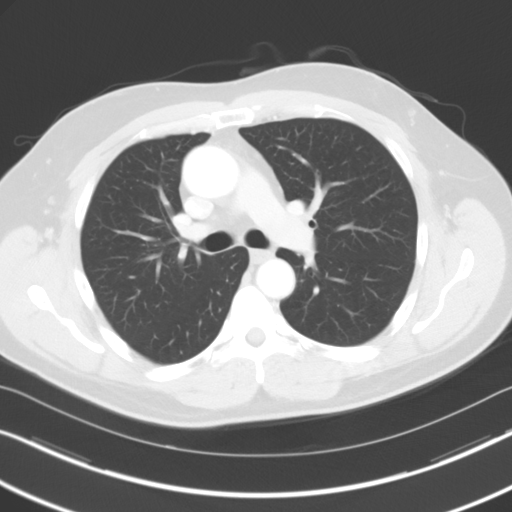
[im 46/66  mediastinal]
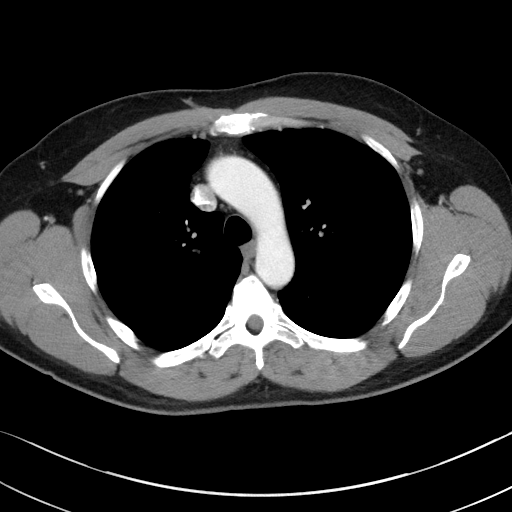
[im 46/66  lung]
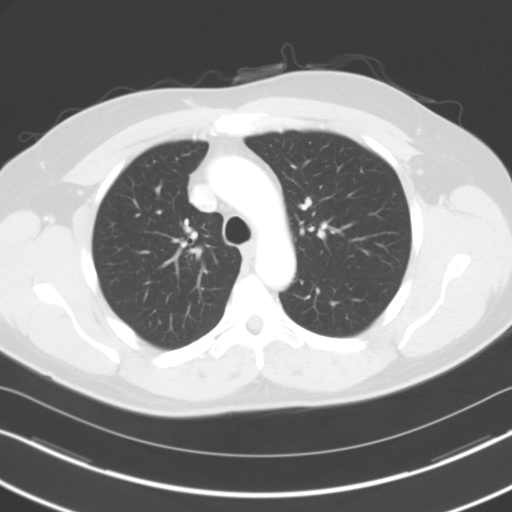
[im 51/66  lung]
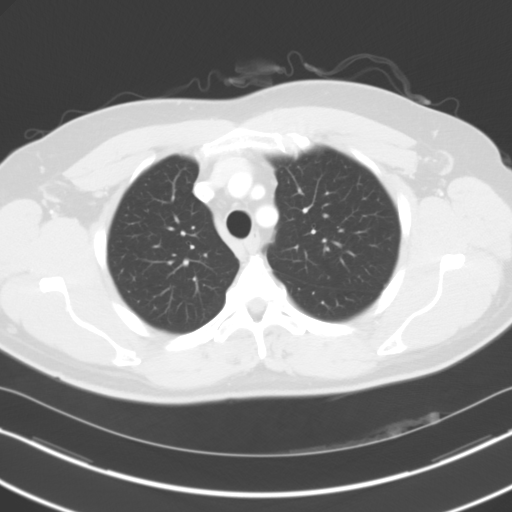
[im 56/66  lung]
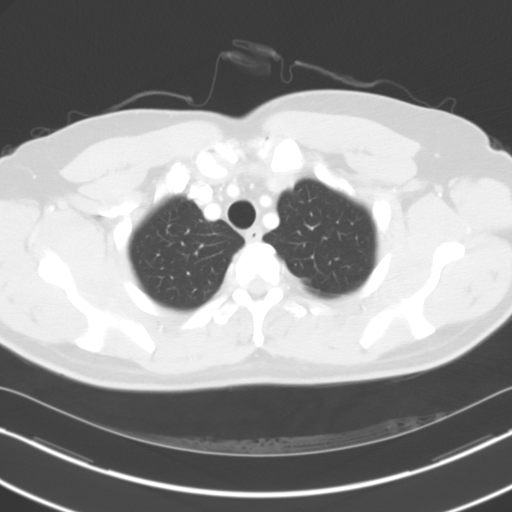
[im 61/66  lung]
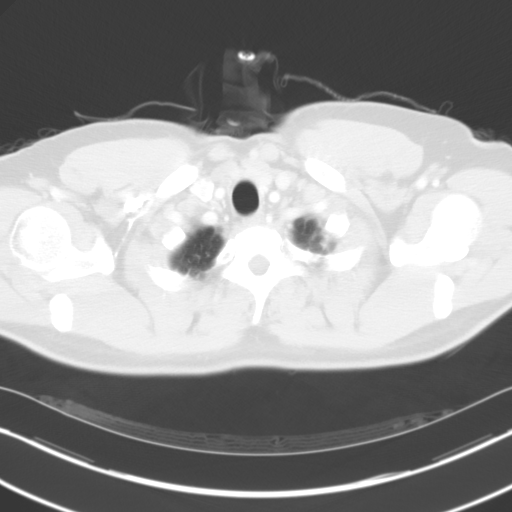

[Series 5: cor routine chest with · coronal · 0.66mm/px · 3 of 141 slices shown]
[im 29/141  lung]
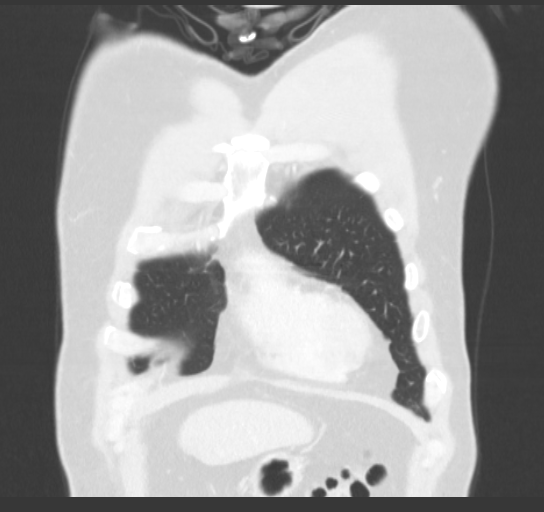
[im 57/141  lung]
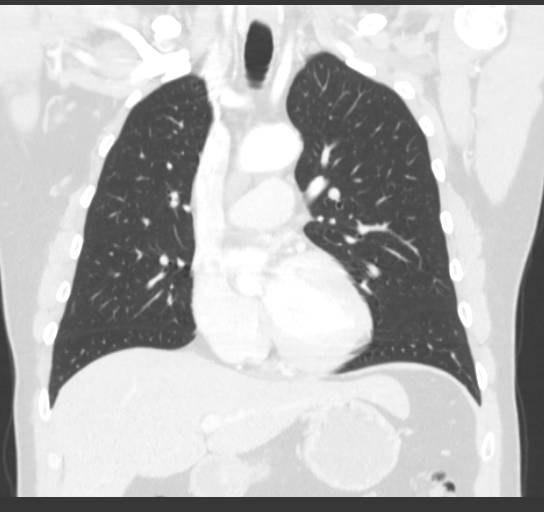
[im 85/141  lung]
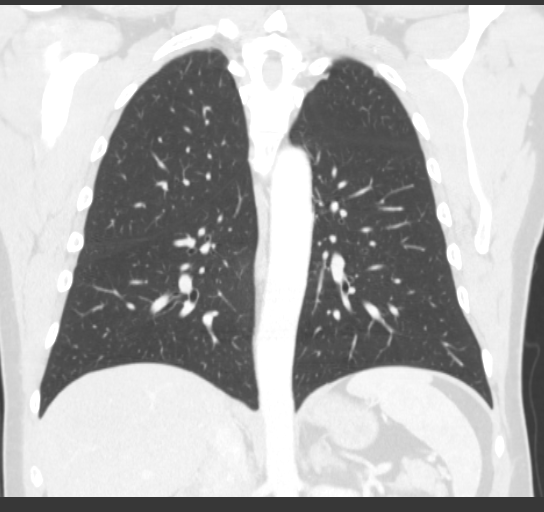

[15 of 36 positions shown; findings below may reference images not displayed]

FINDINGS: There is no evidence for mediastinal, hilar or axillary
lymphadenopathy. Minimal fluid in the pericardial recess. No
significant pericardial effusion or pleural effusions. The ascending
thoracic aorta is prominent measuring up to 4 cm. No evidence to
suggest an aortic dissection. Normal configuration of the aortic
arch and great vessels. Descending thoracic aorta measures 2.6 cm.

Images of the upper abdomen demonstrate cholecystectomy. Slightly
decreased liver attenuation. Main portal venous system is patent.
Normal appearance of the adrenal glands and upper kidneys. No
significant upper abdominal lymphadenopathy.

The trachea and mainstem bronchi are patent. Both lungs are clear.
No evidence for airspace disease or consolidation. No suspicious
nodules.

No acute bone abnormalities.
IMPRESSION: No evidence for metastatic disease in the chest.

No acute chest abnormality.

Enlargement of the ascending thoracic aorta, measuring up to 4 cm.
Recommend annual imaging followup by CTA or MRA. This recommendation
follows 0070 ACCF/AHA/AATS/ACR/ASA/SCA/ALMARAZ/TIGER/PRIVY PHOOLO/NWABISA Guidelines
for the Diagnosis and Management of Patients with Thoracic Aortic
Disease. Circulation. 0070; 121: [id number]

## 2016-06-26 ENCOUNTER — Ambulatory Visit: Payer: PRIVATE HEALTH INSURANCE | Admitting: Family Medicine

## 2016-06-30 ENCOUNTER — Encounter: Payer: Self-pay | Admitting: Family Medicine

## 2016-06-30 ENCOUNTER — Ambulatory Visit (INDEPENDENT_AMBULATORY_CARE_PROVIDER_SITE_OTHER): Payer: PRIVATE HEALTH INSURANCE | Admitting: Family Medicine

## 2016-06-30 DIAGNOSIS — J111 Influenza due to unidentified influenza virus with other respiratory manifestations: Secondary | ICD-10-CM | POA: Diagnosis not present

## 2016-06-30 MED ORDER — OSELTAMIVIR PHOSPHATE 75 MG PO CAPS: 75.0000 mg | ORAL_CAPSULE | Freq: Two times a day (BID) | ORAL | 0 refills | Status: AC

## 2016-06-30 NOTE — Progress Notes (Signed)
Name: Brett Gonzales   MRN: [id number]    DOB: Aug 29, 1965   Date:06/30/2016       Progress Note  Subjective  Chief Complaint  Chief Complaint  Patient presents with  . Influenza    symptoms for 1 day    Influenza  This is a new problem. Associated symptoms include coughing, a fever (Tmax 100.55F), myalgias and a sore throat. Pertinent negatives include no chest pain or chills. Treatments tried: Mucinex cold and flu, and Imodium AD.     Past Medical History:  Diagnosis Date  . Anxiety   . Hyperlipidemia     Past Surgical History:  Procedure Laterality Date  . CHOLECYSTECTOMY    . NASAL SINUS SURGERY Bilateral     Family History  Problem Relation Age of Onset  . Cancer Mother     Cervical  . Diabetes Father     Social History   Social History  . Marital status: Married    Spouse name: N/A  . Number of children: N/A  . Years of education: N/A   Occupational History  . Not on file.   Social History Main Topics  . Smoking status: Former Research scientist (life sciences)  . Smokeless tobacco: Never Used  . Alcohol use 0.0 oz/week     Comment: occasional  . Drug use: No  . Sexual activity: Yes    Partners: Female   Other Topics Concern  . Not on file   Social History Narrative  . No narrative on file     Current Outpatient Prescriptions:  .  acetaminophen (TYLENOL) 650 MG CR tablet, Take 1 tablet by mouth daily., Disp: , Rfl:  .  ALPRAZolam (ALPRAZOLAM XR) 0.5 MG 24 hr tablet, Take 1 tablet (0.5 mg total) by mouth daily., Disp: 30 tablet, Rfl: 2 .  Ascorbic Acid (VITAMIN C PO), Take 2,000 mg by mouth daily., Disp: , Rfl:  .  aspirin 81 MG EC tablet, Take 1 tablet (81 mg total) by mouth daily. Swallow whole., Disp: 30 tablet, Rfl: 12 .  b complex vitamins capsule, Take 1 capsule by mouth daily., Disp: , Rfl:  .  clonazePAM (KLONOPIN) 0.5 MG tablet, Take 1 tablet (0.5 mg total) by mouth daily as needed for anxiety. Take prn only , 30 to last 90 days Fill Nov 21 st, 2017, Disp: 30  tablet, Rfl: 0 .  co-enzyme Q-10 30 MG capsule, Take 30 mg by mouth 3 (three) times daily., Disp: , Rfl:  .  milk thistle 175 MG tablet, Take 175 mg by mouth daily., Disp: , Rfl:  .  Misc Natural Products (GINSENG COMPLEX PO), Take by mouth., Disp: , Rfl:  .  Multiple Vitamin (MULTI-VITAMINS) TABS, Take by mouth., Disp: , Rfl:   Allergies  Allergen Reactions  . Clarithromycin Hives  . Other Hives  . Penicillin G Hives  . Testosterone Other (See Comments)    Tachycardia, hot flashes, flushing  . Androgens Rash    Tachycardia, hot flashes, flushing  . Nsaids Other (See Comments)    History of GI bleed      Review of Systems  Constitutional: Positive for fever (Tmax 100.55F). Negative for chills.  HENT: Positive for sore throat.   Respiratory: Positive for cough.   Cardiovascular: Negative for chest pain.  Musculoskeletal: Positive for myalgias.    Objective  Vitals:   06/30/16 1340  BP: 130/70  Pulse: 87  Resp: 18  Temp: 100.2 F (37.9 C)  TempSrc: Oral  SpO2: 96%  Weight: 175  lb 3.2 oz (79.5 kg)  Height: 5\' 8"  (1.727 m)    Physical Exam  Constitutional: He is oriented to person, place, and time and well-developed, well-nourished, and in no distress.  HENT:  Head: Normocephalic and atraumatic.  Nose: Right sinus exhibits no maxillary sinus tenderness and no frontal sinus tenderness. Left sinus exhibits no maxillary sinus tenderness and no frontal sinus tenderness.  Mouth/Throat: Posterior oropharyngeal erythema present. No oropharyngeal exudate or posterior oropharyngeal edema.  Left and right ear canal with cerumen impaction.  Cardiovascular: Normal rate, regular rhythm, S1 normal, S2 normal and normal heart sounds.   No murmur heard. Pulmonary/Chest: Effort normal and breath sounds normal. He has no wheezes. He has no rhonchi.  Neurological: He is alert and oriented to person, place, and time.  Psychiatric: Mood, memory, affect and judgment normal.  Nursing note  and vitals reviewed.     Assessment & Plan  1. Influenza By history and exam, start on Tamiflu, advised to take tylenol for fever and Mucinex for congestion. - oseltamivir (TAMIFLU) 75 MG capsule; Take 1 capsule (75 mg total) by mouth 2 (two) times daily.  Dispense: 10 capsule; Refill: 0   Frederic Tones [facility] 06/30/2016 1:50 PM

## 2016-07-07 ENCOUNTER — Encounter: Payer: Self-pay | Admitting: Family Medicine

## 2016-07-07 ENCOUNTER — Ambulatory Visit (INDEPENDENT_AMBULATORY_CARE_PROVIDER_SITE_OTHER): Payer: PRIVATE HEALTH INSURANCE | Admitting: Family Medicine

## 2016-07-07 VITALS — BP 133/82 | HR 90 | Temp 97.6°F | Resp 18 | Ht 68.0 in | Wt 172.1 lb

## 2016-07-07 DIAGNOSIS — Z131 Encounter for screening for diabetes mellitus: Secondary | ICD-10-CM | POA: Diagnosis not present

## 2016-07-07 DIAGNOSIS — J111 Influenza due to unidentified influenza virus with other respiratory manifestations: Secondary | ICD-10-CM | POA: Diagnosis not present

## 2016-07-07 DIAGNOSIS — F411 Generalized anxiety disorder: Secondary | ICD-10-CM | POA: Diagnosis not present

## 2016-07-07 DIAGNOSIS — E785 Hyperlipidemia, unspecified: Secondary | ICD-10-CM

## 2016-07-07 DIAGNOSIS — R058 Other specified cough: Secondary | ICD-10-CM

## 2016-07-07 DIAGNOSIS — R05 Cough: Secondary | ICD-10-CM

## 2016-07-07 MED ORDER — BUDESONIDE-FORMOTEROL FUMARATE 160-4.5 MCG/ACT IN AERO: 2.0000 | INHALATION_SPRAY | Freq: Two times a day (BID) | RESPIRATORY_TRACT | 0 refills | Status: DC

## 2016-07-07 MED ORDER — CEFDINIR 300 MG PO CAPS: 300.0000 mg | ORAL_CAPSULE | Freq: Two times a day (BID) | ORAL | 0 refills | Status: DC

## 2016-07-07 MED ORDER — ALPRAZOLAM ER 0.5 MG PO TB24: 0.5000 mg | ORAL_TABLET | Freq: Every day | ORAL | 2 refills | Status: DC

## 2016-07-07 MED ORDER — CLONAZEPAM 0.5 MG PO TABS: 0.5000 mg | ORAL_TABLET | Freq: Every day | ORAL | 0 refills | Status: DC | PRN

## 2016-07-07 NOTE — Progress Notes (Signed)
Name: Brett Gonzales   MRN: [id number]    DOB: February 22, 1966   Date:07/07/2016       Progress Note  Subjective  Chief Complaint  Chief Complaint  Patient presents with  . Medication Refill    3 month F/U  . Anxiety    Worst and states the Alprazolam does not work well. States he has tried tons of [employer] in 2004 and didn't help.  . Cough    States since having Flu last week, he is still having coughing up yellow and green phelgm. Also feels like he has weak breathing and sob at times. Patient took Tamiflu and missed a week out of work.    HPI   Hyperlipidemia: Based on the results of lipid panel his cardiovascular risk factor ( using [facility] ) in the next 10 years is 5.2 % and does not need statin therapy at this time He denies chest pain or decrease in exercise tolerance. He is very active, he has a physical job and likes to work out ( rides bikes ). Discussed importance of dietary changes to improved HDL and decrease triglycerides.   Anxiety Disorder: he was on Klonopin four times a day  for many years. He states he was started on Ativan by Dr. Janae Bridgeman many years ago. He states he tried many medications without success in the past. Discussed SSRI and Alprazolam XR. We switched him from Klonopin 0.5 mg four times daily to Alprazolam XR 0.5 mg daily and Klonopin 0.5 prn panic attack ( 30 pills to last 90 days ). He states he feels like medication wears off in the mid to late afternoon, and has to step out to take some deep breaths to relax, he denies any full blown panic attacks, he states he has been under a lot of stress lately, father-in-law was very sick and died in 03-15-23 and he got wrote up at work shortly after the day after he went back from bereavement period. Recently lost a cousin for cancer. Discussed Atenolol but he states stress is going down again and he does not want to start any other medications at this time.  History of Rectal cancer: it was caught early by a  colonoscopy, last one in 06/2015 - he is doing well now, weight has stabilized,  repeat in 3 years. He denies change in bowel movement or rectal bleeding.   Recent flu/productive cough: he was seen by Dr. Manuella Ghazi last week with flu like symptoms. He is not a smoker but states Tamiflu helped with symptoms however over the past day his cough has been worse and this morning yellowish/green phlegm, but no SOB. He has mild hoarseness also   Patient Active Problem List   Diagnosis Date Noted  . External hemorrhoid 03/20/2016  . History of GI bleed 08/26/2015  . GAD (generalized anxiety disorder) 03/28/2015  . Hyperlipidemia 03/28/2015  . History of rectal cancer 07/22/2014    Past Surgical History:  Procedure Laterality Date  . CHOLECYSTECTOMY    . NASAL SINUS SURGERY Bilateral     Family History  Problem Relation Age of Onset  . Cancer Mother     Cervical  . Diabetes Father     Social History   Social History  . Marital status: Married    Spouse name: N/A  . Number of children: N/A  . Years of education: N/A   Occupational History  . Not on file.   Social History Main Topics  . Smoking status: Former Research scientist (life sciences)  .  Smokeless tobacco: Never Used  . Alcohol use 0.0 oz/week     Comment: occasional  . Drug use: No  . Sexual activity: Yes    Partners: Female   Other Topics Concern  . Not on file   Social History Narrative  . No narrative on file     Current Outpatient Prescriptions:  .  acetaminophen (TYLENOL) 650 MG CR tablet, Take 1 tablet by mouth daily., Disp: , Rfl:  .  ALPRAZolam (ALPRAZOLAM XR) 0.5 MG 24 hr tablet, Take 1 tablet (0.5 mg total) by mouth daily., Disp: 30 tablet, Rfl: 2 .  Ascorbic Acid (VITAMIN C PO), Take 2,000 mg by mouth daily., Disp: , Rfl:  .  aspirin 81 MG EC tablet, Take 1 tablet (81 mg total) by mouth daily. Swallow whole., Disp: 30 tablet, Rfl: 12 .  b complex vitamins capsule, Take 1 capsule by mouth daily., Disp: , Rfl:  .  clonazePAM  (KLONOPIN) 0.5 MG tablet, Take 1 tablet (0.5 mg total) by mouth daily as needed for anxiety. Take prn only , 30 to last 90 days, Disp: 30 tablet, Rfl: 0 .  co-enzyme Q-10 30 MG capsule, Take 30 mg by mouth 3 (three) times daily., Disp: , Rfl:  .  milk thistle 175 MG tablet, Take 175 mg by mouth daily., Disp: , Rfl:  .  Misc Natural Products (GINSENG COMPLEX PO), Take by mouth., Disp: , Rfl:  .  Multiple Vitamin (MULTI-VITAMINS) TABS, Take by mouth., Disp: , Rfl:   Allergies  Allergen Reactions  . Clarithromycin Hives  . Other Hives  . Penicillin G Hives  . Testosterone Other (See Comments)    Tachycardia, hot flashes, flushing  . Androgens Rash    Tachycardia, hot flashes, flushing  . Nsaids Other (See Comments)    History of GI bleed      ROS  Constitutional: Negative for fever or significant weight change.  Respiratory: Positive  for cough and shortness of breath.   Cardiovascular: Negative for chest pain or palpitations.  Gastrointestinal: Negative for abdominal pain, no bowel changes.  Musculoskeletal: Negative for gait problem or joint swelling.  Skin: Negative for rash.  Neurological: Negative for dizziness or headache.  No other specific complaints in a complete review of systems (except as listed in HPI above).  Objective  Vitals:   07/07/16 0956  BP: 133/82  Pulse: 90  Resp: 18  Temp: 97.6 F (36.4 C)  TempSrc: Oral  SpO2: 97%  Weight: 172 lb 1.6 oz (78.1 kg)  Height: 5\' 8"  (1.727 m)    Body mass index is 26.17 kg/m.  Physical Exam  Constitutional: Patient appears well-developed and well-nourished. Obese No distress.  HEENT: head atraumatic, normocephalic, pupils equal and reactive to light, ears normal TM bilaterally, neck supple, throat within normal limits Cardiovascular: Normal rate, regular rhythm and normal heart sounds.  No murmur heard. No BLE edema. Pulmonary/Chest: Effort normal rhonchi and wheezing bilaterally  No respiratory  distress. Abdominal: Soft.  There is no tenderness. Psychiatric: Patient has a normal mood and affect. behavior is normal. Judgment and thought content normal.  PHQ2/9: Depression screen [facility] 2/9 07/07/2016 09/26/2015 08/26/2015 03/28/2015  Decreased Interest 0 0 0 0  Down, Depressed, Hopeless 0 0 0 0  PHQ - 2 Score 0 0 0 0     Fall Risk: Fall Risk  07/07/2016 09/26/2015 08/26/2015 03/28/2015  Falls in the past year? No No No No    Functional Status Survey: Is the patient deaf or have  difficulty hearing?: No Does the patient have difficulty seeing, even when wearing glasses/contacts?: No Does the patient have difficulty concentrating, remembering, or making decisions?: No Does the patient have difficulty walking or climbing stairs?: No Does the patient have difficulty dressing or bathing?: No Does the patient have difficulty doing errands alone such as visiting a doctor's office or shopping?: No    Assessment & Plan  1. Generalized anxiety disorder  - clonazePAM (KLONOPIN) 0.5 MG tablet; Take 1 tablet (0.5 mg total) by mouth daily as needed for anxiety. Take prn only , 30 to last 90 days  Dispense: 30 tablet; Refill: 0 - ALPRAZolam (ALPRAZOLAM XR) 0.5 MG 24 hr tablet; Take 1 tablet (0.5 mg total) by mouth daily.  Dispense: 30 tablet; Refill: 2 - COMPLETE METABOLIC PANEL WITH GFR  2. Dyslipidemia  - Lipid panel - COMPLETE METABOLIC PANEL WITH GFR  3. Productive cough  He is allergic to clarithromycin and PNC, discussed possible risk of reaction with cephalosporin or try a quinolone, he wants to try cephalosporin, advised to monitor for SOB, wheezing or rash and call 911 if severe symptoms or contact us if mild symptoms and we will try changing medication. Also explained that he may try Symbicort first and hold off on starting antibiotics.  Discussed CXR but he would like to hold off. Likely bronchitis, we will try inhaler and a Zpack - CBC with Differential/Platelet - COMPLETE  METABOLIC PANEL WITH GFR - cefdinir (OMNICEF) 300 MG capsule; Take 1 capsule (300 mg total) by mouth 2 (two) times daily.  Dispense: 14 capsule; Refill: 0 - budesonide-formoterol (SYMBICORT) 160-4.5 MCG/ACT inhaler; Inhale 2 puffs into the lungs 2 (two) times daily.  Dispense: 1 Inhaler; Refill: 0  4. Diabetes mellitus screening  - Hemoglobin A1c - Insulin, fasting  5. Bronchitis with flu  - budesonide-formoterol (SYMBICORT) 160-4.5 MCG/ACT inhaler; Inhale 2 puffs into the lungs 2 (two) times daily.  Dispense: 1 Inhaler; Refill: 0

## 2016-07-22 ENCOUNTER — Other Ambulatory Visit: Payer: Self-pay | Admitting: Family Medicine

## 2016-07-22 LAB — CBC WITH DIFFERENTIAL/PLATELET
BASOS ABS: 0 {cells}/uL (ref 0–200)
BASOS PCT: 0 %
EOS ABS: 284 {cells}/uL (ref 15–500)
Eosinophils Relative: 4 %
HCT: 43 % (ref 38.5–50.0)
Hemoglobin: 14.7 g/dL (ref 13.2–17.1)
Lymphocytes Relative: 35 %
Lymphs Abs: 2485 cells/uL (ref 850–3900)
MCH: 30.4 pg (ref 27.0–33.0)
MCHC: 34.2 g/dL (ref 32.0–36.0)
MCV: 88.8 fL (ref 80.0–100.0)
MONO ABS: 852 {cells}/uL (ref 200–950)
MONOS PCT: 12 %
MPV: 12 fL (ref 7.5–12.5)
NEUTROS ABS: 3479 {cells}/uL (ref 1500–7800)
Neutrophils Relative %: 49 %
PLATELETS: 252 10*3/uL (ref 140–400)
RBC: 4.84 MIL/uL (ref 4.20–5.80)
RDW: 13.6 % (ref 11.0–15.0)
WBC: 7.1 10*3/uL (ref 3.8–10.8)

## 2016-07-23 LAB — COMPLETE METABOLIC PANEL WITH GFR
ALT: 27 U/L (ref 9–46)
AST: 29 U/L (ref 10–35)
Albumin: 4.3 g/dL (ref 3.6–5.1)
Alkaline Phosphatase: 36 U/L — ABNORMAL LOW (ref 40–115)
BUN: 10 mg/dL (ref 7–25)
CHLORIDE: 104 mmol/L (ref 98–110)
CO2: 31 mmol/L (ref 20–31)
Calcium: 9.5 mg/dL (ref 8.6–10.3)
Creat: 0.8 mg/dL (ref 0.70–1.33)
GLUCOSE: 95 mg/dL (ref 65–99)
POTASSIUM: 4.4 mmol/L (ref 3.5–5.3)
SODIUM: 142 mmol/L (ref 135–146)
TOTAL PROTEIN: 7.3 g/dL (ref 6.1–8.1)
Total Bilirubin: 0.7 mg/dL (ref 0.2–1.2)

## 2016-07-23 LAB — LIPID PANEL
CHOL/HDL RATIO: 5.9 ratio — AB (ref <5.0)
Cholesterol: 214 mg/dL — ABNORMAL HIGH (ref <200)
HDL: 36 mg/dL — AB (ref >40)
LDL Cholesterol: 123 mg/dL — ABNORMAL HIGH (ref <100)
Triglycerides: 274 mg/dL — ABNORMAL HIGH (ref <150)
VLDL: 55 mg/dL — ABNORMAL HIGH (ref <30)

## 2016-07-23 LAB — INSULIN, FASTING: Insulin fasting, serum: 16.5 u[IU]/mL (ref 2.0–19.6)

## 2016-07-23 LAB — HEMOGLOBIN A1C
HEMOGLOBIN A1C: 5.3 % (ref <5.7)
Mean Plasma Glucose: 105 mg/dL

## 2016-09-08 ENCOUNTER — Ambulatory Visit (INDEPENDENT_AMBULATORY_CARE_PROVIDER_SITE_OTHER): Payer: PRIVATE HEALTH INSURANCE | Admitting: Family Medicine

## 2016-09-08 ENCOUNTER — Encounter: Payer: Self-pay | Admitting: Family Medicine

## 2016-09-08 ENCOUNTER — Other Ambulatory Visit: Payer: Self-pay | Admitting: Family Medicine

## 2016-09-08 VITALS — BP 138/86 | HR 84 | Temp 97.7°F | Resp 16 | Ht 68.0 in | Wt 173.9 lb

## 2016-09-08 DIAGNOSIS — E8881 Metabolic syndrome: Secondary | ICD-10-CM

## 2016-09-08 DIAGNOSIS — E785 Hyperlipidemia, unspecified: Secondary | ICD-10-CM | POA: Diagnosis not present

## 2016-09-08 DIAGNOSIS — F411 Generalized anxiety disorder: Secondary | ICD-10-CM

## 2016-09-08 DIAGNOSIS — E88819 Insulin resistance, unspecified: Secondary | ICD-10-CM | POA: Insufficient documentation

## 2016-09-08 MED ORDER — ALPRAZOLAM ER 0.5 MG PO TB24
0.5000 mg | ORAL_TABLET | Freq: Every day | ORAL | 2 refills | Status: AC
Start: 2016-09-08 — End: ?

## 2016-09-08 MED ORDER — CLONAZEPAM 0.5 MG PO TABS: 0.5000 mg | ORAL_TABLET | Freq: Every day | ORAL | 0 refills | Status: AC | PRN

## 2016-09-08 MED ORDER — KRILL OIL 500 MG PO CAPS: 1.0000 | ORAL_CAPSULE | Freq: Every day | ORAL | 2 refills | Status: AC

## 2016-09-08 MED ORDER — ASPIRIN 81 MG PO TBEC: 81.0000 mg | DELAYED_RELEASE_TABLET | Freq: Every day | ORAL | 12 refills | Status: AC

## 2016-09-08 NOTE — Progress Notes (Signed)
Name: Brett Gonzales   MRN: [id number]    DOB: [birth year]   Date:09/08/2016       Progress Note  Subjective  Chief Complaint  Chief Complaint  Patient presents with  . Medication Refill  . Anxiety    HPI  Hyperlipidemia: Based on the results of lipid panel his cardiovascular risk factor ( using [facility] ) in the next 10 years is 5.2 % and does not need statin therapy at this time He denies chest pain or decrease in exercise tolerance. He is very active, he has a physical job and likes to work out ( rides bikes ). Discussed importance of dietary changes to improved HDL and decrease triglycerides. He has started [employer] and we will recheck labs on his next visit  Anxiety Disorder: he was on Klonopin four times a day  for many years. He states he was started on Ativan by Dr. Janae Bridgeman many years ago. He states he tried many medications without success in the past. Discussed SSRI and Alprazolam XR. We switched him from Klonopin 0.5 mg four times daily to Alprazolam XR 0.5 mg daily and Klonopin 0.5 prn panic attack ( 30 pills to last 90 days ). He states he feels like medication wears off in the mid to late afternoon, and has to step out to take some deep breaths to relax, he denies any full blown panic attack. He is changing jobs , work at a newer apartment complex, he is feeling nervous but happy at the same time.   Metabolic Syndrome: discussed fasting insulin, discussed life style modification.    Patient Active Problem List   Diagnosis Date Noted  . Insulin resistance 09/08/2016  . External hemorrhoid 03/20/2016  . History of GI bleed 08/26/2015  . GAD (generalized anxiety disorder) 03/28/2015  . Hyperlipidemia 03/28/2015  . History of rectal cancer 07/22/2014    Past Surgical History:  Procedure Laterality Date  . CHOLECYSTECTOMY    . NASAL SINUS SURGERY Bilateral     Family History  Problem Relation Age of Onset  . Cancer Mother     Cervical  . Diabetes Father      Social History   Social History  . Marital status: Married    Spouse name: N/A  . Number of children: N/A  . Years of education: N/A   Occupational History  . Not on file.   Social History Main Topics  . Smoking status: Former Research scientist (life sciences)  . Smokeless tobacco: Never Used  . Alcohol use 0.0 oz/week     Comment: occasional  . Drug use: No  . Sexual activity: Yes    Partners: Female   Other Topics Concern  . Not on file   Social History Narrative  . No narrative on file     Current Outpatient Prescriptions:  .  acetaminophen (TYLENOL) 650 MG CR tablet, Take 1 tablet by mouth daily., Disp: , Rfl:  .  ALPRAZolam (ALPRAZOLAM XR) 0.5 MG 24 hr tablet, Take 1 tablet (0.5 mg total) by mouth daily., Disp: 30 tablet, Rfl: 2 .  Ascorbic Acid (VITAMIN C PO), Take 2,000 mg by mouth daily., Disp: , Rfl:  .  aspirin 81 MG EC tablet, Take 1 tablet (81 mg total) by mouth daily. Swallow whole., Disp: 30 tablet, Rfl: 12 .  b complex vitamins capsule, Take 1 capsule by mouth daily., Disp: , Rfl:  .  clonazePAM (KLONOPIN) 0.5 MG tablet, Take 1 tablet (0.5 mg total) by mouth daily as needed  for anxiety. Fill may 25th, 2018 Take prn only , 30 to last 90 days, Disp: 30 tablet, Rfl: 0 .  co-enzyme Q-10 30 MG capsule, Take 30 mg by mouth 3 (three) times daily., Disp: , Rfl:  .  milk thistle 175 MG tablet, Take 175 mg by mouth daily., Disp: , Rfl:  .  Misc Natural Products (GINSENG COMPLEX PO), Take by mouth., Disp: , Rfl:  .  Multiple Vitamin (MULTI-VITAMINS) TABS, Take by mouth., Disp: , Rfl:   Allergies  Allergen Reactions  . Clarithromycin Hives  . Other Hives  . Penicillin G Hives  . Testosterone Other (See Comments)    Tachycardia, hot flashes, flushing  . Androgens Rash    Tachycardia, hot flashes, flushing  . Nsaids Other (See Comments)    History of GI bleed      ROS  Constitutional: Negative for fever or weight change.  Respiratory: Negative for cough and shortness of breath.    Cardiovascular: Negative for chest pain or palpitations.  Gastrointestinal: Negative for abdominal pain, no bowel changes.  Musculoskeletal: Negative for gait problem or joint swelling.  Skin: Negative for rash.  Neurological: Negative for dizziness or headache.  No other specific complaints in a complete review of systems (except as listed in HPI above).  Objective  Vitals:   09/08/16 1450  BP: 138/86  Pulse: 84  Resp: 16  Temp: 97.7 F (36.5 C)  TempSrc: Oral  SpO2: 96%  Weight: 173 lb 14.4 oz (78.9 kg)  Height: 5' 8"  (1.727 m)    Body mass index is 26.44 kg/m.  Physical Exam  Constitutional: Patient appears well-developed and well-nourished.  No distress.  HEENT: head atraumatic, normocephalic, pupils equal and reactive to light, neck supple, throat within normal limits Cardiovascular: Normal rate, regular rhythm and normal heart sounds.  No murmur heard. No BLE edema. Pulmonary/Chest: Effort normal and breath sounds normal. No respiratory distress. Abdominal: Soft.  There is no tenderness. Psychiatric: Patient has a normal mood and affect. behavior is normal. Judgment and thought content normal.   Recent Results (from the past 2160 hour(s))  COMPLETE METABOLIC PANEL WITH GFR     Status: Abnormal   Collection Time: 07/22/16  8:07 AM  Result Value Ref Range   Sodium 142 135 - 146 mmol/L   Potassium 4.4 3.5 - 5.3 mmol/L   Chloride 104 98 - 110 mmol/L   CO2 31 20 - 31 mmol/L   Glucose, Bld 95 65 - 99 mg/dL   BUN 10 7 - 25 mg/dL   Creat 0.80 0.70 - 1.33 mg/dL    Comment:   For patients > or = 53 years of age: The upper reference limit for Creatinine is approximately 13% higher for people identified as African-American.      Total Bilirubin 0.7 0.2 - 1.2 mg/dL   Alkaline Phosphatase 36 (L) 40 - 115 U/L   AST 29 10 - 35 U/L   ALT 27 9 - 46 U/L   Total Protein 7.3 6.1 - 8.1 g/dL   Albumin 4.3 3.6 - 5.1 g/dL   Calcium 9.5 8.6 - 10.3 mg/dL   GFR, Est African  American >89 >=60 mL/min   GFR, Est Non African American >89 >=60 mL/min  CBC with Differential/Platelet     Status: None   Collection Time: 07/22/16  8:07 AM  Result Value Ref Range   WBC 7.1 3.8 - 10.8 K/uL   RBC 4.84 4.20 - 5.80 MIL/uL   Hemoglobin 14.7 13.2 -  17.1 g/dL   HCT 43.0 38.5 - 50.0 %   MCV 88.8 80.0 - 100.0 fL   MCH 30.4 27.0 - 33.0 pg   MCHC 34.2 32.0 - 36.0 g/dL   RDW 13.6 11.0 - 15.0 %   Platelets 252 140 - 400 K/uL   MPV 12.0 7.5 - 12.5 fL   Neutro Abs 3,479 1,500 - 7,800 cells/uL   Lymphs Abs 2,485 850 - 3,900 cells/uL   Monocytes Absolute 852 200 - 950 cells/uL   Eosinophils Absolute 284 15 - 500 cells/uL   Basophils Absolute 0 0 - 200 cells/uL   Neutrophils Relative % 49 %   Lymphocytes Relative 35 %   Monocytes Relative 12 %   Eosinophils Relative 4 %   Basophils Relative 0 %   Smear Review Criteria for review not met   Lipid panel     Status: Abnormal   Collection Time: 07/22/16  8:07 AM  Result Value Ref Range   Cholesterol 214 (H) <200 mg/dL   Triglycerides 274 (H) <150 mg/dL   HDL 36 (L) >40 mg/dL   Total CHOL/HDL Ratio 5.9 (H) <5.0 Ratio   VLDL 55 (H) <30 mg/dL   LDL Cholesterol 123 (H) <100 mg/dL  Hemoglobin A1c     Status: None   Collection Time: 07/22/16  8:07 AM  Result Value Ref Range   Hgb A1c MFr Bld 5.3 <5.7 %    Comment:   For the purpose of screening for the presence of diabetes:   <5.7%       Consistent with the absence of diabetes 5.7-6.4 %   Consistent with increased risk for diabetes (prediabetes) >=6.5 %     Consistent with diabetes   This assay result is consistent with a decreased risk of diabetes.   Currently, no consensus exists regarding use of hemoglobin A1c for diagnosis of diabetes in children.   According to American Diabetes Association (ADA) guidelines, hemoglobin A1c <7.0% represents optimal control in non-pregnant diabetic patients. Different metrics may apply to specific patient populations. Standards of  Medical Care in Diabetes (ADA).      Mean Plasma Glucose 105 mg/dL  Insulin, fasting     Status: None   Collection Time: 07/22/16  8:07 AM  Result Value Ref Range   Insulin fasting, serum 16.5 2.0 - 19.6 uIU/mL    Comment:   This insulin assay shows strong cross-reactivity for some insulin analogs (lispro, aspart, and glargine) and much lower cross-reactivity with others (detemir, glulisine).   Stimulated Insulin reference intervals were established using the Siemens Immulite assay. These values are provided for general guidance only.      PHQ2/9: Depression screen [facility] 2/9 09/08/2016 07/07/2016 09/26/2015 08/26/2015 03/28/2015  Decreased Interest 0 0 0 0 0  Down, Depressed, Hopeless 0 0 0 0 0  PHQ - 2 Score 0 0 0 0 0     Fall Risk: Fall Risk  09/08/2016 07/07/2016 09/26/2015 08/26/2015 03/28/2015  Falls in the past year? No No No No No     Functional Status Survey: Is the patient deaf or have difficulty hearing?: No Does the patient have difficulty seeing, even when wearing glasses/contacts?: No Does the patient have difficulty concentrating, remembering, or making decisions?: No Does the patient have difficulty walking or climbing stairs?: No Does the patient have difficulty dressing or bathing?: No Does the patient have difficulty doing errands alone such as visiting a doctor's office or shopping?: No    Assessment & Plan  1. Generalized anxiety  disorder  He is changing jobs and would like to get rx today but he will fill when due  - ALPRAZolam (ALPRAZOLAM XR) 0.5 MG 24 hr tablet; Take 1 tablet (0.5 mg total) by mouth daily.  Dispense: 30 tablet; Refill: 2 - clonazePAM (KLONOPIN) 0.5 MG tablet; Take 1 tablet (0.5 mg total) by mouth daily as needed for anxiety. Fill may 25th, 2018 Take prn only , 30 to last 90 days  Dispense: 30 tablet; Refill: 0  2. Dyslipidemia  He started [employer], does not want rx at this time, we will recheck labs  3. Insulin  resistance  Discussed importance of life style modification

## 2016-10-06 ENCOUNTER — Ambulatory Visit: Payer: PRIVATE HEALTH INSURANCE | Admitting: Family Medicine

## 2017-01-01 ENCOUNTER — Ambulatory Visit: Payer: PRIVATE HEALTH INSURANCE | Admitting: Family Medicine

## 2017-08-23 ENCOUNTER — Encounter: Payer: Self-pay | Admitting: Family Medicine

## 2017-08-25 ENCOUNTER — Other Ambulatory Visit: Payer: Self-pay

## 2017-08-25 ENCOUNTER — Telehealth: Payer: Self-pay

## 2017-08-25 DIAGNOSIS — Z1211 Encounter for screening for malignant neoplasm of colon: Secondary | ICD-10-CM

## 2017-08-25 NOTE — Telephone Encounter (Signed)
Gastroenterology Pre-Procedure Review  Request Date: 09/17/17 Requesting Physician: Dr. Allen Norris  PATIENT REVIEW QUESTIONS: The patient responded to the following health history questions as indicated:    1. Are you having any GI issues? no 2. Do you have a personal history of Polyps? yes (year unspecified) 3. Do you have a family history of Colon Cancer or Polyps? history of rectal cancer 4. Diabetes Mellitus? no 5. Joint replacements in the past 12 months?no 6. Major health problems in the past 3 months?no 7. Any artificial heart valves, MVP, or defibrillator?no    MEDICATIONS & ALLERGIES:    Patient reports the following regarding taking any anticoagulation/antiplatelet therapy:   Plavix, Coumadin, Eliquis, Xarelto, Lovenox, Pradaxa, Brilinta, or Effient? no Aspirin? yes (81 mg daily)  Patient confirms/reports the following medications:  Current Outpatient Medications  Medication Sig Dispense Refill  . acetaminophen (TYLENOL) 650 MG CR tablet Take 1 tablet by mouth daily.    Marland Kitchen ALPRAZolam (ALPRAZOLAM XR) 0.5 MG 24 hr tablet Take 1 tablet (0.5 mg total) by mouth daily. 30 tablet 2  . Ascorbic Acid (VITAMIN C PO) Take 2,000 mg by mouth daily.    Marland Kitchen aspirin 81 MG EC tablet Take 1 tablet (81 mg total) by mouth daily. Swallow whole. 30 tablet 12  . b complex vitamins capsule Take 1 capsule by mouth daily.    . clonazePAM (KLONOPIN) 0.5 MG tablet Take 1 tablet (0.5 mg total) by mouth daily as needed for anxiety. Fill may 25th, 2018 Take prn only , 30 to last 90 days 30 tablet 0  . co-enzyme Q-10 30 MG capsule Take 30 mg by mouth 3 (three) times daily.    Javier Docker Oil 500 MG CAPS Take 1 capsule (500 mg total) by mouth daily. 30 capsule 2  . milk thistle 175 MG tablet Take 175 mg by mouth daily.    . Misc Natural Products (GINSENG COMPLEX PO) Take by mouth.    . Multiple Vitamin (MULTI-VITAMINS) TABS Take by mouth.     No current facility-administered medications for this visit.     Patient  confirms/reports the following allergies:  Allergies  Allergen Reactions  . Clarithromycin Hives  . Other Hives  . Penicillin G Hives  . Testosterone Other (See Comments)    Tachycardia, hot flashes, flushing  . Androgens Rash    Tachycardia, hot flashes, flushing  . Nsaids Other (See Comments)    History of GI bleed     No orders of the defined types were placed in this encounter.   AUTHORIZATION INFORMATION Primary Insurance: 1D#: Group #:  Secondary Insurance: 1D#: Group #:  SCHEDULE INFORMATION: Date: May 10th Time: Location:[facility]

## 2017-09-13 ENCOUNTER — Telehealth: Payer: Self-pay | Admitting: Gastroenterology

## 2017-09-13 NOTE — Telephone Encounter (Signed)
Pt left vm he states he contacted [employer] to see if Procedure for 05/10 in [place] was pre authorized he states it was not he needs the Billing code for his Insurance please call pt

## 2017-09-13 NOTE — Telephone Encounter (Signed)
Pt advised I cannot give him diagnosis codes for his procedures as Dr. Allen Norris does the coding after his procedure. This will depend on the findings of his colonoscopy. Pt was given the code that was used to precert for [place]. Pt will discuss with Dr. Allen Norris prior procedure.

## 2017-09-14 ENCOUNTER — Encounter: Payer: Self-pay | Admitting: Anesthesiology

## 2017-09-15 ENCOUNTER — Other Ambulatory Visit: Payer: Self-pay | Admitting: Family Medicine

## 2017-09-15 DIAGNOSIS — R7401 Elevation of levels of liver transaminase levels: Secondary | ICD-10-CM

## 2017-09-15 DIAGNOSIS — R740 Nonspecific elevation of levels of transaminase and lactic acid dehydrogenase [LDH]: Principal | ICD-10-CM

## 2017-09-15 NOTE — Discharge Instructions (Signed)
General Anesthesia, Adult, Care After °These instructions provide you with information about caring for yourself after your procedure. Your health care provider may also give you more specific instructions. Your treatment has been planned according to current medical practices, but problems sometimes occur. Call your health care provider if you have any problems or questions after your procedure. °What can I expect after the procedure? °After the procedure, it is common to have: °· Vomiting. °· A sore throat. °· Mental slowness. ° °It is common to feel: °· Nauseous. °· Cold or shivery. °· Sleepy. °· Tired. °· Sore or achy, even in parts of your body where you did not have surgery. ° °Follow these instructions at home: °For at least 24 hours after the procedure: °· Do not: °? Participate in activities where you could fall or become injured. °? Drive. °? Use heavy machinery. °? Drink alcohol. °? Take sleeping pills or medicines that cause drowsiness. °? Make important decisions or sign legal documents. °? Take care of children on your own. °· Rest. °Eating and drinking °· If you vomit, drink water, juice, or soup when you can drink without vomiting. °· Drink enough fluid to keep your urine clear or pale yellow. °· Make sure you have little or no nausea before eating solid foods. °· Follow the diet recommended by your health care provider. °General instructions °· Have a responsible adult stay with you until you are awake and alert. °· Return to your normal activities as told by your health care provider. Ask your health care provider what activities are safe for you. °· Take over-the-counter and prescription medicines only as told by your health care provider. °· If you smoke, do not smoke without supervision. °· Keep all follow-up visits as told by your health care provider. This is important. °Contact a health care provider if: °· You continue to have nausea or vomiting at home, and medicines are not helpful. °· You  cannot drink fluids or start eating again. °· You cannot urinate after 8-12 hours. °· You develop a skin rash. °· You have fever. °· You have increasing redness at the site of your procedure. °Get help right away if: °· You have difficulty breathing. °· You have chest pain. °· You have unexpected bleeding. °· You feel that you are having a life-threatening or urgent problem. °This information is not intended to replace advice given to you by your health care provider. Make sure you discuss any questions you have with your health care provider. °Document Released: 08/03/2000 Document Revised: 09/30/2015 Document Reviewed: 04/11/2015 °Elsevier Interactive Patient Education © 2018 [employer] ° °

## 2017-09-17 ENCOUNTER — Ambulatory Visit: Payer: BLUE CROSS/BLUE SHIELD | Admitting: Anesthesiology

## 2017-09-17 ENCOUNTER — Encounter
Admission: RE | Disposition: A | Discharge status: Home / Self Care | Payer: Self-pay | Source: Ambulatory Visit | Attending: Gastroenterology

## 2017-09-17 ENCOUNTER — Ambulatory Visit
Admission: RE | Admit: 2017-09-17 | Discharge: 2017-09-17 | Disposition: A | Discharge status: Home / Self Care | Payer: BLUE CROSS/BLUE SHIELD | Source: Ambulatory Visit | Attending: Gastroenterology | Admitting: Gastroenterology

## 2017-09-17 DIAGNOSIS — K621 Rectal polyp: Secondary | ICD-10-CM

## 2017-09-17 DIAGNOSIS — Z79899 Other long term (current) drug therapy: Secondary | ICD-10-CM | POA: Insufficient documentation

## 2017-09-17 DIAGNOSIS — Z1211 Encounter for screening for malignant neoplasm of colon: Secondary | ICD-10-CM | POA: Diagnosis not present

## 2017-09-17 DIAGNOSIS — Z7982 Long term (current) use of aspirin: Secondary | ICD-10-CM | POA: Insufficient documentation

## 2017-09-17 DIAGNOSIS — Z85038 Personal history of other malignant neoplasm of large intestine: Secondary | ICD-10-CM | POA: Diagnosis not present

## 2017-09-17 DIAGNOSIS — F419 Anxiety disorder, unspecified: Secondary | ICD-10-CM | POA: Diagnosis not present

## 2017-09-17 DIAGNOSIS — Z87891 Personal history of nicotine dependence: Secondary | ICD-10-CM | POA: Diagnosis not present

## 2017-09-17 DIAGNOSIS — E785 Hyperlipidemia, unspecified: Secondary | ICD-10-CM | POA: Insufficient documentation

## 2017-09-17 HISTORY — PX: POLYPECTOMY: SHX5525

## 2017-09-17 HISTORY — DX: Heartburn: R12

## 2017-09-17 HISTORY — PX: COLONOSCOPY WITH PROPOFOL: SHX5780

## 2017-09-17 SURGERY — COLONOSCOPY WITH PROPOFOL
Anesthesia: General | Site: Rectum | Wound class: Contaminated

## 2017-09-17 MED ORDER — STERILE WATER FOR IRRIGATION IR SOLN
Status: DC | PRN
  Administered 2017-09-17: .5 mL

## 2017-09-17 MED ORDER — PROPOFOL 10 MG/ML IV BOLUS
INTRAVENOUS | Status: DC | PRN
  Administered 2017-09-17 (×2): 80 mg via INTRAVENOUS
  Administered 2017-09-17: 40 mg via INTRAVENOUS

## 2017-09-17 MED ORDER — LIDOCAINE HCL (CARDIAC) PF 100 MG/5ML IV SOSY
PREFILLED_SYRINGE | INTRAVENOUS | Status: DC | PRN
  Administered 2017-09-17: 20 mg via INTRAVENOUS

## 2017-09-17 MED ORDER — LACTATED RINGERS IV SOLN
INTRAVENOUS | Status: DC
  Administered 2017-09-17 (×2): via INTRAVENOUS

## 2017-09-17 SURGICAL SUPPLY — 6 items
CANISTER SUCT 1200ML W/VALVE (MISCELLANEOUS) ×4 IMPLANT
FORCEPS BIOP RAD 4 LRG CAP 4 (CUTTING FORCEPS) ×4 IMPLANT
GOWN CVR UNV OPN BCK APRN NK (MISCELLANEOUS) ×4 IMPLANT
GOWN ISOL THUMB LOOP REG UNIV (MISCELLANEOUS) ×4
KIT ENDO PROCEDURE OLY (KITS) ×4 IMPLANT
WATER STERILE IRR 250ML POUR (IV SOLUTION) ×4 IMPLANT

## 2017-09-17 NOTE — Transfer of Care (Signed)
Immediate Anesthesia Transfer of Care Note  Patient: Brett Gonzales  Procedure(s) Performed: COLONOSCOPY WITH PROPOFOL (N/A Rectum)  Patient Location: PACU  Anesthesia Type: General  Level of Consciousness: awake, alert  and patient cooperative  Airway and Oxygen Therapy: Patient Spontanous Breathing and Patient connected to supplemental oxygen  Post-op Assessment: Post-op Vital signs reviewed, Patient's Cardiovascular Status Stable, Respiratory Function Stable, Patent Airway and No signs of Nausea or vomiting  Post-op Vital Signs: Reviewed and stable  Complications: No apparent anesthesia complications

## 2017-09-17 NOTE — Op Note (Signed)
[facility] Gastroenterology Patient Name: Brett Gonzales Procedure Date: 09/17/2017 10:02 AM MRN: [id number] Account #: [id number] Date of Birth: Apr 03, 1966 Admit Type: Outpatient Age: 52 Room: [facility] OR ROOM 01 Gender: Male Note Status: Finalized Procedure:            Colonoscopy Indications:          High risk colon cancer surveillance: Personal history                        of colon cancer Providers:            Lucilla Lame MD, MD Referring MD:         Bethena Roys. Sowles, MD (Referring MD) Medicines:            Monitored Anesthesia Care Complications:        No immediate complications. Procedure:            Pre-Anesthesia Assessment:                       - Prior to the procedure, a History and Physical was                        performed, and patient medications and allergies were                        reviewed. The patient's tolerance of previous                        anesthesia was also reviewed. The risks and benefits of                        the procedure and the sedation options and risks were                        discussed with the patient. All questions were                        answered, and informed consent was obtained. Prior                        Anticoagulants: The patient has taken no previous                        anticoagulant or antiplatelet agents. ASA Grade                        Assessment: II - A patient with mild systemic disease.                        After reviewing the risks and benefits, the patient was                        deemed in satisfactory condition to undergo the                        procedure.                       After obtaining informed consent, the colonoscope was  passed under direct vision. Throughout the procedure,                        the patient's blood pressure, pulse, and oxygen                        saturations were monitored continuously. The [place] [id number]) was introduced through the                        anus and advanced to the the cecum, identified by                        appendiceal orifice and ileocecal valve. The                        colonoscopy was performed without difficulty. The                        patient tolerated the procedure well. The quality of                        the bowel preparation was excellent. Findings:      The perianal and digital rectal examinations were normal.      Two sessile polyps were found in the rectum. The polyps were 1 to 2 mm       in size. These polyps were removed with a cold biopsy forceps. Resection       and retrieval were complete.      A scar was found in the rectum.      A tattoo was seen in the rectum. Impression:           - Two 1 to 2 mm polyps in the rectum, removed with a                        cold biopsy forceps. Resected and retrieved.                       - Scar in the rectum.                       - A tattoo was seen in the rectum. Recommendation:       - Discharge patient to home.                       - Resume previous diet.                       - Continue present medications.                       - Repeat colonoscopy in 3 years for surveillance. Procedure Code(s):    --- Professional ---                       [phone], Colonoscopy, flexible; with biopsy, single or                        multiple Diagnosis Code(s):    --- Professional ---  Z85.038, Personal history of other malignant neoplasm                        of large intestine                       K62.1, Rectal polyp CPT copyright 2017 American Medical Association. All rights reserved. The codes documented in this report are preliminary and upon coder review may  be revised to meet current compliance requirements. Lucilla Lame MD, MD 09/17/2017 10:34:05 AM This report has been signed electronically. Number of Addenda: 0 Note Initiated On: 09/17/2017 10:02 AM Scope  Withdrawal Time: 0 hours 7 minutes 40 seconds  Total Procedure Duration: 0 hours 11 minutes 28 seconds       [facility]

## 2017-09-17 NOTE — H&P (Signed)
Brett Lame, MD [address], [place] [address] Phone:[phone] Fax : [phone]  Primary Care Physician:  Theotis Burrow, MD Primary Gastroenterologist:  Dr. Allen Norris  Pre-Procedure History & Physical: HPI:  Brett Gonzales is a 52 y.o. male is here for an colonoscopy.   Past Medical History:  Diagnosis Date  . Anxiety   . Heartburn   . Hyperlipidemia     Past Surgical History:  Procedure Laterality Date  . CHOLECYSTECTOMY    . COLONOSCOPY    . NASAL SINUS SURGERY Bilateral     Prior to Admission medications   Medication Sig Start Date End Date Taking? Authorizing Provider  acetaminophen (TYLENOL) 650 MG CR tablet Take 1 tablet by mouth daily.   Yes [provider]  Ascorbic Acid (VITAMIN C PO) Take 2,000 mg by mouth daily.   Yes [provider]  aspirin 81 MG EC tablet Take 1 tablet (81 mg total) by mouth daily. Swallow whole. 09/08/16  Yes Steele Sizer, MD  b complex vitamins capsule Take 1 capsule by mouth daily.   Yes [provider]  clonazePAM (KLONOPIN) 0.5 MG tablet Take 1 tablet (0.5 mg total) by mouth daily as needed for anxiety. Fill may 25th, 2018 Take prn only , 30 to last 90 days 09/08/16  Yes Sowles, Drue Stager, MD  milk thistle 175 MG tablet Take 175 mg by mouth daily.   Yes [provider]  Multiple Vitamin (MULTI-VITAMINS) TABS Take by mouth.   Yes [provider]  ranitidine (ZANTAC) 150 MG tablet Take 150 mg by mouth 2 (two) times daily.   Yes [provider]  ALPRAZolam (ALPRAZOLAM XR) 0.5 MG 24 hr tablet Take 1 tablet (0.5 mg total) by mouth daily. Patient not taking: Reported on 09/17/2017 09/08/16   Steele Sizer, MD  co-enzyme Q-10 30 MG capsule Take 30 mg by mouth 3 (three) times daily.    [provider]  Javier Docker Oil 500 MG CAPS Take 1 capsule (500 mg total) by mouth daily. Patient not taking: Reported on 09/13/2017 09/08/16   Steele Sizer, MD  Misc Natural  Products [facility] COMPLEX PO) Take by mouth.    [provider]    Allergies as of 08/25/2017 - Review Complete 09/08/2016  Allergen Reaction Noted  . Clarithromycin Hives 08/26/2015  . Other Hives 03/28/2015  . Penicillin g Hives 03/28/2015  . Testosterone Other (See Comments) 08/26/2015  . Androgens Rash 03/28/2015  . Nsaids Other (See Comments) 03/28/2015    Family History  Problem Relation Age of Onset  . Cancer Mother        Cervical  . Diabetes Father     Social History   Socioeconomic History  . Marital status: Married    Spouse name: Not on file  . Number of children: Not on file  . Years of education: Not on file  . Highest education level: Not on file  Occupational History  . Not on file  Social Needs  . Financial resource strain: Not on file  . Food insecurity:    Worry: Not on file    Inability: Not on file  . Transportation needs:    Medical: Not on file    Non-medical: Not on file  Tobacco Use  . Smoking status: Former Research scientist (life sciences)  . Smokeless tobacco: Never Used  Substance and Sexual Activity  . Alcohol use: Yes    Alcohol/week: 0.0 oz    Comment: occasional  . Drug use: No  .  Sexual activity: Yes    Partners: Female  Lifestyle  . Physical activity:    Days per week: Not on file    Minutes per session: Not on file  . Stress: Not on file  Relationships  . Social connections:    Talks on phone: Not on file    Gets together: Not on file    Attends religious service: Not on file    Active member of club or organization: Not on file    Attends meetings of clubs or organizations: Not on file    Relationship status: Not on file  . Intimate partner violence:    Fear of current or ex partner: Not on file    Emotionally abused: Not on file    Physically abused: Not on file    Forced sexual activity: Not on file  Other Topics Concern  . Not on file  Social History Narrative  . Not on file    Review of Systems: See HPI, otherwise  negative ROS  Physical Exam: BP 136/85   Pulse 73   Temp (!) 97.3 F (36.3 C) (Temporal)   Resp 16   Ht 5\' 8"  (1.727 m)   Wt 168 lb (76.2 kg)   SpO2 100%   BMI 25.54 kg/m  General:   Alert,  pleasant and cooperative in NAD Head:  Normocephalic and atraumatic. Neck:  Supple; no masses or thyromegaly. Lungs:  Clear throughout to auscultation.    Heart:  Regular rate and rhythm. Abdomen:  Soft, nontender and nondistended. Normal bowel sounds, without guarding, and without rebound.   Neurologic:  Alert and  oriented x4;  grossly normal neurologically.  Impression/Plan: Brett Gonzales is here for an colonoscopy to be performed for history of colon cancer  Risks, benefits, limitations, and alternatives regarding  colonoscopy have been reviewed with the patient.  Questions have been answered.  All parties agreeable.   Brett Lame, MD  09/17/2017, 10:06 AM

## 2017-09-17 NOTE — Anesthesia Procedure Notes (Signed)
Procedure Name: MAC Date/Time: 09/17/2017 10:16 AM Performed by: Lind Guest, CRNA Pre-anesthesia Checklist: Patient identified, Emergency Drugs available, Suction available, Patient being monitored and Timeout performed Patient Re-evaluated:Patient Re-evaluated prior to induction Oxygen Delivery Method: Nasal cannula

## 2017-09-17 NOTE — Anesthesia Postprocedure Evaluation (Signed)
Anesthesia Post Note  Patient: Brett Gonzales  Procedure(s) Performed: COLONOSCOPY WITH PROPOFOL (N/A Rectum)  Patient location during evaluation: PACU Anesthesia Type: General Level of consciousness: awake and alert Pain management: pain level controlled Vital Signs Assessment: post-procedure vital signs reviewed and stable Respiratory status: spontaneous breathing Cardiovascular status: blood pressure returned to baseline Postop Assessment: no headache Anesthetic complications: no    Jaci Standard, III,  Trevor Wilkie D

## 2017-09-17 NOTE — Anesthesia Preprocedure Evaluation (Signed)
Anesthesia Evaluation  Patient identified by MRN, date of birth, ID band Patient awake    Reviewed: Allergy & Precautions, H&P , NPO status , Patient's Chart, lab work & pertinent test results  Airway Mallampati: II  TM Distance: >3 FB Neck ROM: full    Dental no notable dental hx.    Pulmonary former smoker,    Pulmonary exam normal        Cardiovascular negative cardio ROS Normal cardiovascular exam     Neuro/Psych    GI/Hepatic negative GI ROS, Neg liver ROS,   Endo/Other  negative endocrine ROS  Renal/GU negative Renal ROS     Musculoskeletal   Abdominal   Peds  Hematology negative hematology ROS (+)   Anesthesia Other Findings   Reproductive/Obstetrics negative OB ROS                             Anesthesia Physical Anesthesia Plan  ASA: II  Anesthesia Plan: General   Post-op Pain Management:    Induction:   PONV Risk Score and Plan:   Airway Management Planned:   Additional Equipment:   Intra-op Plan:   Post-operative Plan:   Informed Consent: I have reviewed the patients History and Physical, chart, labs and discussed the procedure including the risks, benefits and alternatives for the proposed anesthesia with the patient or authorized representative who has indicated his/her understanding and acceptance.     Plan Discussed with:   Anesthesia Plan Comments:         Anesthesia Quick Evaluation

## 2017-09-20 ENCOUNTER — Encounter: Payer: Self-pay | Admitting: Gastroenterology

## 2018-01-17 ENCOUNTER — Ambulatory Visit
Admission: RE | Admit: 2018-01-17 | Discharge: 2018-01-17 | Disposition: A | Discharge status: Home / Self Care | Payer: BLUE CROSS/BLUE SHIELD | Source: Ambulatory Visit | Attending: Family Medicine | Admitting: Family Medicine

## 2018-01-17 DIAGNOSIS — R740 Nonspecific elevation of levels of transaminase and lactic acid dehydrogenase [LDH]: Secondary | ICD-10-CM | POA: Insufficient documentation

## 2018-01-17 DIAGNOSIS — Z9049 Acquired absence of other specified parts of digestive tract: Secondary | ICD-10-CM | POA: Diagnosis not present

## 2018-01-17 DIAGNOSIS — R7401 Elevation of levels of liver transaminase levels: Secondary | ICD-10-CM

## 2020-07-03 IMAGING — US US ABDOMEN COMPLETE
1 series · 14 of 25 positions shown · non-contrast
Comparison: Body CT 06/22/2014

CLINICAL DATA: Transaminitis.

EXAM:
ABDOMEN ULTRASOUND COMPLETE

[Series 1: us abdomen complete · 0.25mm/px · 14 of 104 slices shown]
[im 1/104]
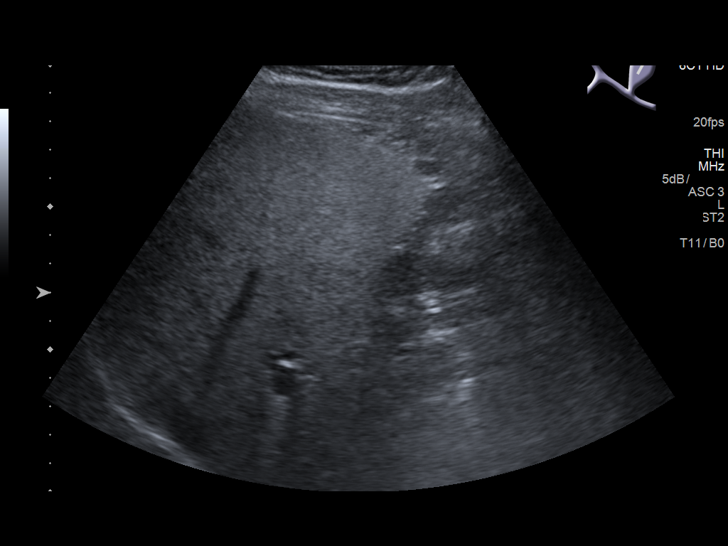
[im 9/104]
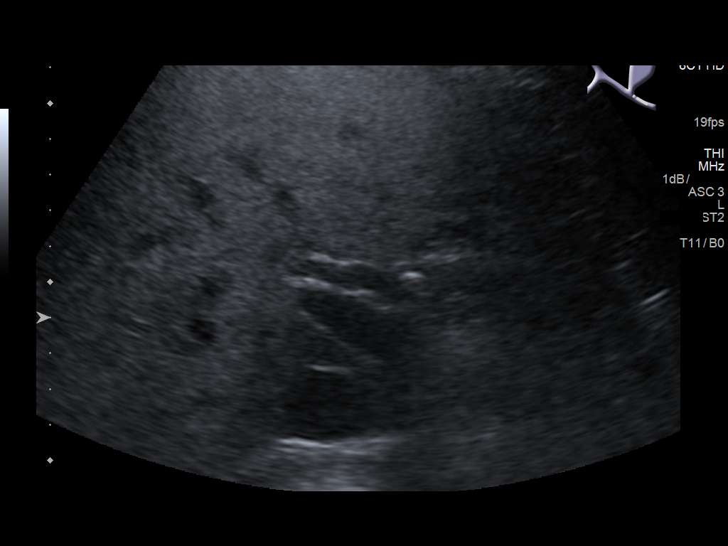
[im 18/104]
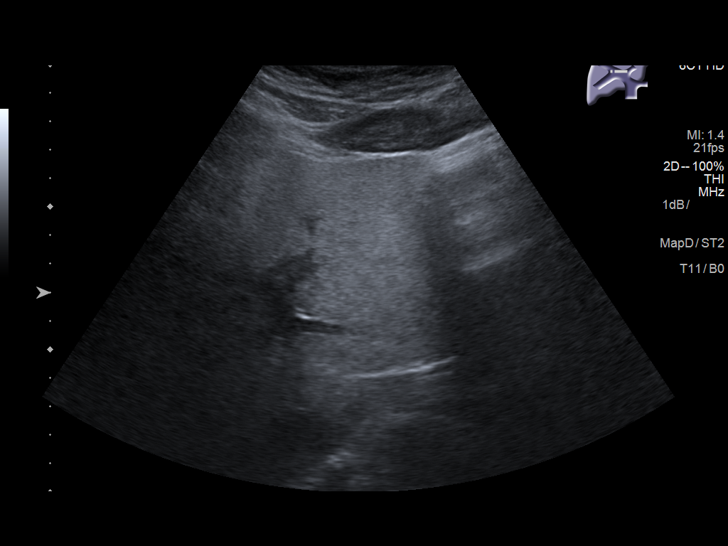
[im 26/104]
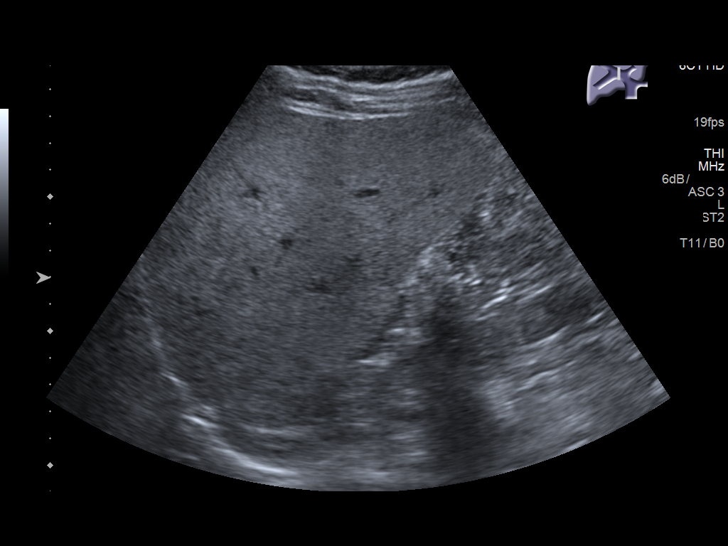
[im 35/104]
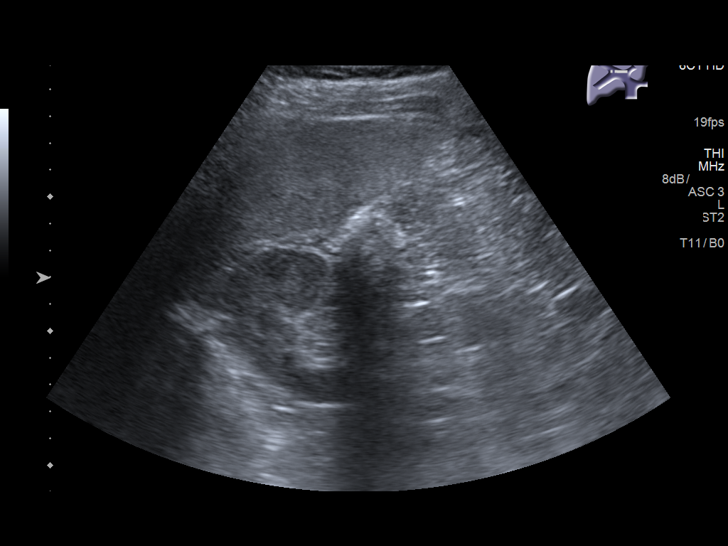
[im 39/104]
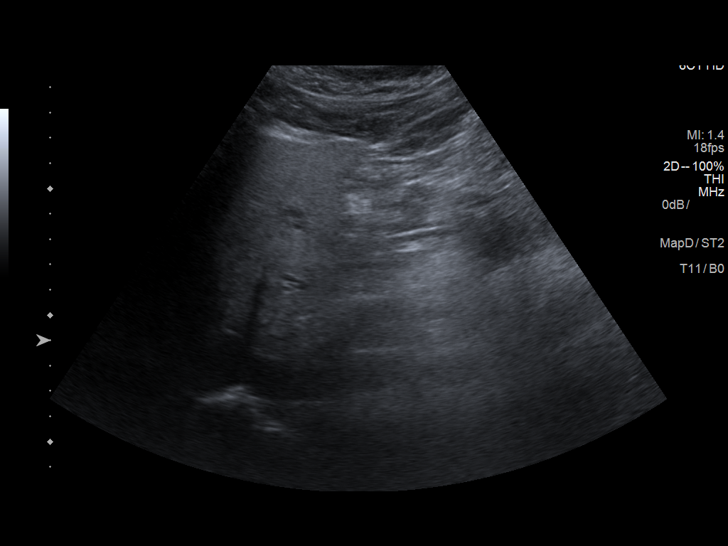
[im 48/104]
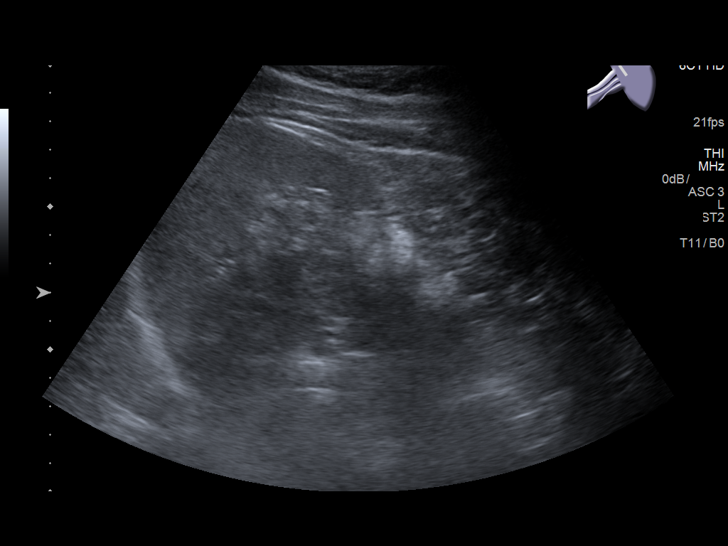
[im 56/104]
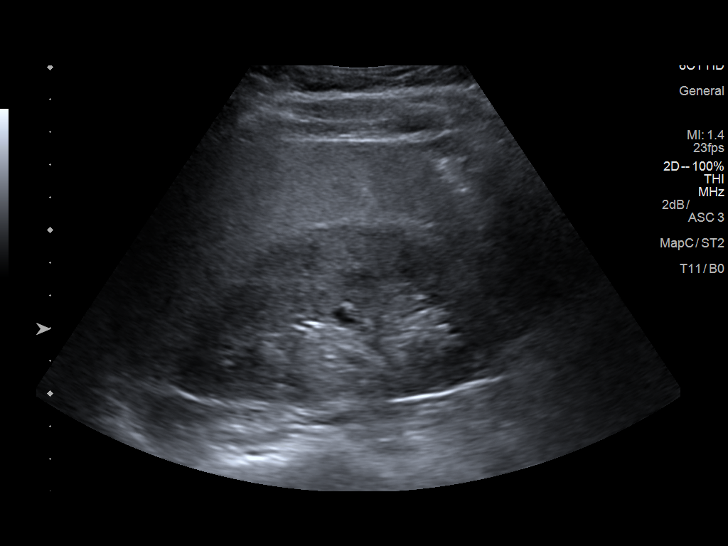
[im 65/104]
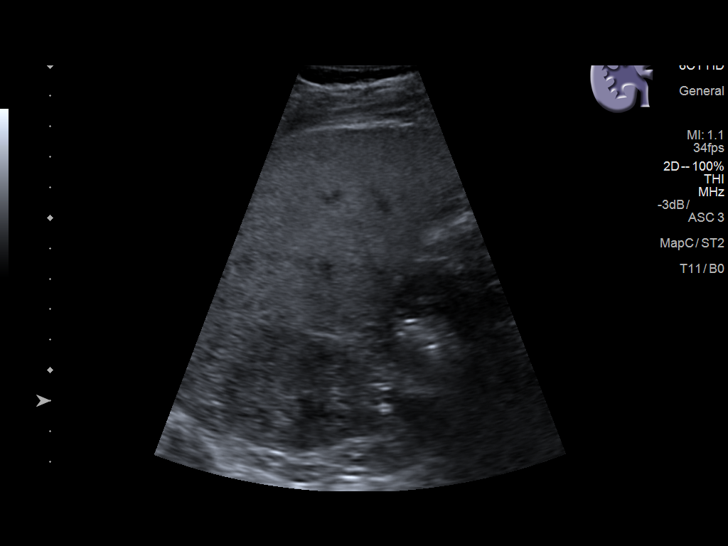
[im 69/104]
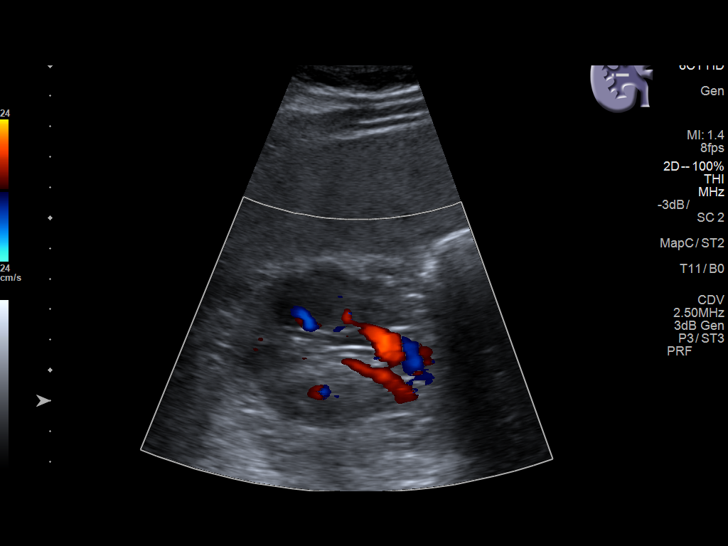
[im 78/104]
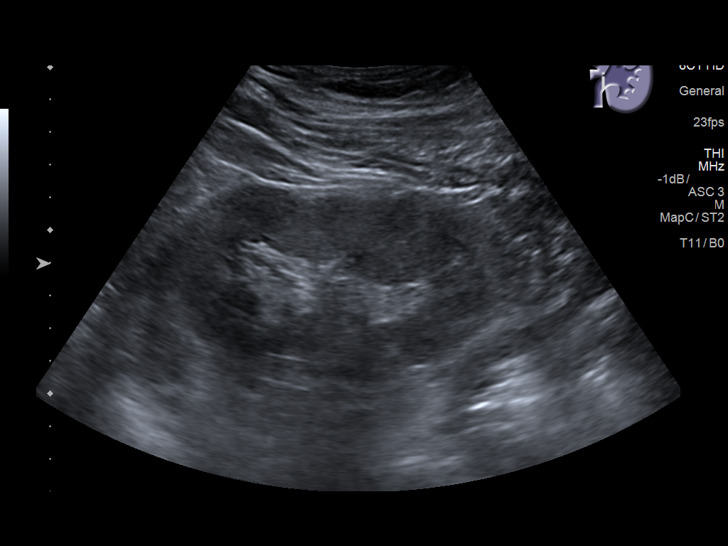
[im 86/104]
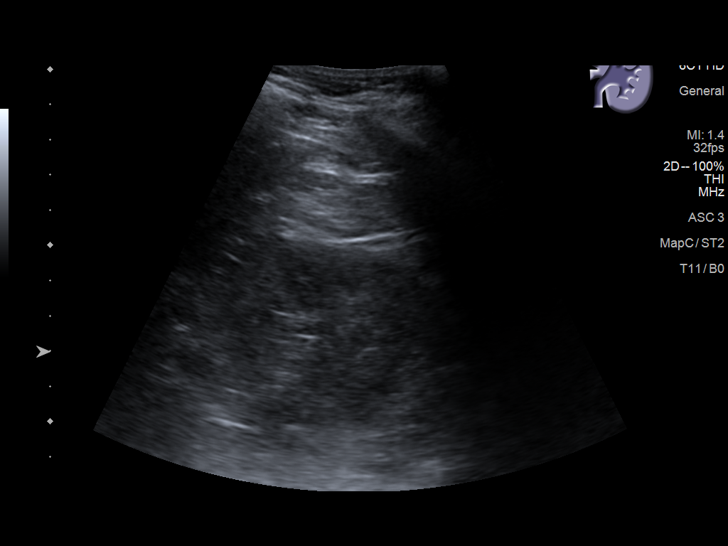
[im 95/104]
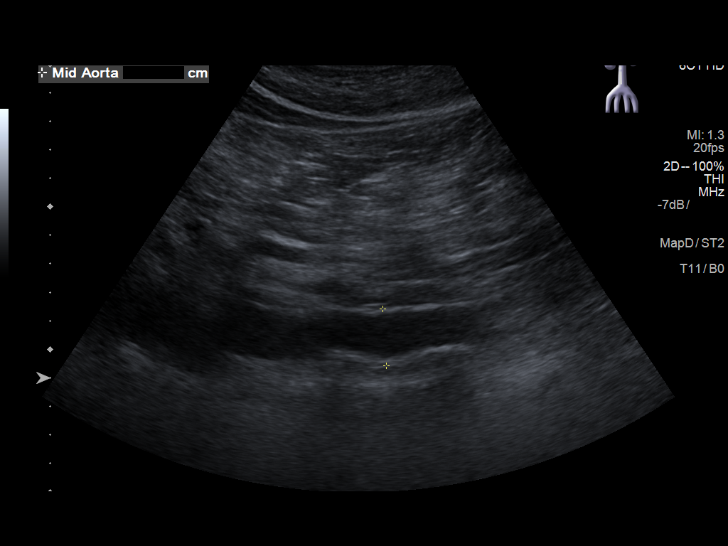
[im 104/104]
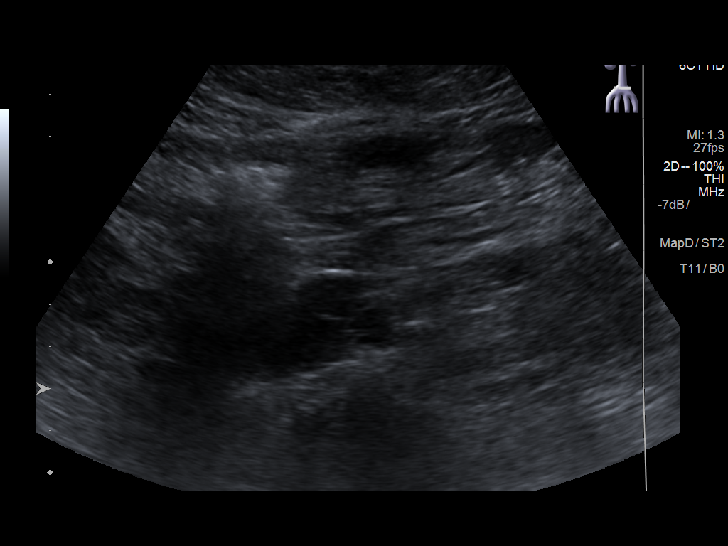

[14 of 25 positions shown; findings below may reference images not displayed]

FINDINGS: Gallbladder: Post cholecystectomy.

Common bile duct: Diameter: 7.8 mm

Liver: No focal lesion identified. Diffusely increased parenchymal
echogenicity. Portal vein is patent on color Doppler imaging with
normal direction of blood flow towards the liver.

IVC: No abnormality visualized.

Pancreas: Visualized portion unremarkable.

Spleen: Size and appearance within normal limits.

Right Kidney: Length: 11.4 cm. Echogenicity within normal limits. No
mass or hydronephrosis visualized.

Left Kidney: Length: 11.9 cm. Echogenicity within normal limits. No
mass or hydronephrosis visualized. 8 mm benign-appearing cyst,
exophytic off of the midpole region of the left kidney.

Abdominal aorta: No aneurysm visualized.

Other findings: None.
IMPRESSION: Status post cholecystectomy.

Diffusely increased parenchymal echogenicity of the liver usually
seen with hepatic steatosis or fibrosis.

## 2021-09-29 ENCOUNTER — Telehealth: Payer: Self-pay | Admitting: Gastroenterology

## 2021-09-29 ENCOUNTER — Telehealth: Payer: Self-pay

## 2021-09-29 ENCOUNTER — Other Ambulatory Visit: Payer: Self-pay

## 2021-09-29 DIAGNOSIS — Z86010 Personal history of colonic polyps: Secondary | ICD-10-CM

## 2021-09-29 MED ORDER — NA SULFATE-K SULFATE-MG SULF 17.5-3.13-1.6 GM/177ML PO SOLN: 1.0000 | Freq: Once | ORAL | 0 refills | Status: AC

## 2021-09-29 NOTE — Telephone Encounter (Signed)
Gastroenterology Pre-Procedure Review  Request Date: 10/31/21 Requesting Physician: Dr. Allen Norris  PATIENT REVIEW QUESTIONS: The patient responded to the following health history questions as indicated:    1. Are you having any GI issues? no 2. Do you have a personal history of Polyps? yes (09/17/2017 Dr. Allen Norris) 3. Do you have a family history of Colon Cancer or Polyps? no 4. Diabetes Mellitus? no 5. Joint replacements in the past 12 months?no 6. Major health problems in the past 3 months?no 7. Any artificial heart valves, MVP, or defibrillator?no    MEDICATIONS & ALLERGIES:    Patient reports the following regarding taking any anticoagulation/antiplatelet therapy:   Plavix, Coumadin, Eliquis, Xarelto, Lovenox, Pradaxa, Brilinta, or Effient? no Aspirin? '81mg'$  Aspirin  Patient confirms/reports the following medications:  Current Outpatient Medications  Medication Sig Dispense Refill   acetaminophen (TYLENOL) 650 MG CR tablet Take 1 tablet by mouth daily.     ALPRAZolam (ALPRAZOLAM XR) 0.5 MG 24 hr tablet Take 1 tablet (0.5 mg total) by mouth daily. (Patient not taking: Reported on 09/17/2017) 30 tablet 2   Ascorbic Acid (VITAMIN C PO) Take 2,000 mg by mouth daily.     aspirin 81 MG EC tablet Take 1 tablet (81 mg total) by mouth daily. Swallow whole. 30 tablet 12   b complex vitamins capsule Take 1 capsule by mouth daily.     clonazePAM (KLONOPIN) 0.5 MG tablet Take 1 tablet (0.5 mg total) by mouth daily as needed for anxiety. Fill may 25th, 2018 Take prn only , 30 to last 90 days 30 tablet 0   co-enzyme Q-10 30 MG capsule Take 30 mg by mouth 3 (three) times daily.     Krill Oil 500 MG CAPS Take 1 capsule (500 mg total) by mouth daily. (Patient not taking: Reported on 09/13/2017) 30 capsule 2   milk thistle 175 MG tablet Take 175 mg by mouth daily.     Misc Natural Products (GINSENG COMPLEX PO) Take by mouth.     Multiple Vitamin (MULTI-VITAMINS) TABS Take by mouth.     ranitidine (ZANTAC) 150  MG tablet Take 150 mg by mouth 2 (two) times daily.     No current facility-administered medications for this visit.    Patient confirms/reports the following allergies:  Allergies  Allergen Reactions   Clarithromycin Hives   Other Hives   Penicillin G Hives   Testosterone Other (See Comments)    Tachycardia, hot flashes, flushing   Androgens Rash    Tachycardia, hot flashes, flushing   Nsaids Other (See Comments)    History of GI bleed     No orders of the defined types were placed in this encounter.   AUTHORIZATION INFORMATION Primary Insurance: 1D#: Group #:  Secondary Insurance: 1D#: Group #:  SCHEDULE INFORMATION: Date: 10/31/21 Time: Location: [place]

## 2021-09-29 NOTE — Telephone Encounter (Signed)
Pt would like a call back to get scheduled for a colonoscopy

## 2021-10-23 ENCOUNTER — Encounter: Payer: Self-pay | Admitting: Gastroenterology

## 2021-10-31 ENCOUNTER — Other Ambulatory Visit: Payer: Self-pay

## 2021-10-31 ENCOUNTER — Encounter
Admission: RE | Disposition: A | Discharge status: Home / Self Care | Payer: Self-pay | Source: Home / Self Care | Attending: Gastroenterology

## 2021-10-31 ENCOUNTER — Ambulatory Visit: Payer: 59 | Admitting: Anesthesiology

## 2021-10-31 ENCOUNTER — Ambulatory Visit
Admission: RE | Admit: 2021-10-31 | Discharge: 2021-10-31 | Disposition: A | Discharge status: Home / Self Care | Payer: 59 | Attending: Gastroenterology | Admitting: Gastroenterology

## 2021-10-31 DIAGNOSIS — Z1211 Encounter for screening for malignant neoplasm of colon: Secondary | ICD-10-CM | POA: Diagnosis present

## 2021-10-31 DIAGNOSIS — D120 Benign neoplasm of cecum: Secondary | ICD-10-CM | POA: Insufficient documentation

## 2021-10-31 DIAGNOSIS — Z85048 Personal history of other malignant neoplasm of rectum, rectosigmoid junction, and anus: Secondary | ICD-10-CM | POA: Insufficient documentation

## 2021-10-31 DIAGNOSIS — K621 Rectal polyp: Secondary | ICD-10-CM | POA: Insufficient documentation

## 2021-10-31 DIAGNOSIS — Z86010 Personal history of colonic polyps: Secondary | ICD-10-CM

## 2021-10-31 DIAGNOSIS — Z87891 Personal history of nicotine dependence: Secondary | ICD-10-CM | POA: Insufficient documentation

## 2021-10-31 DIAGNOSIS — K219 Gastro-esophageal reflux disease without esophagitis: Secondary | ICD-10-CM | POA: Diagnosis not present

## 2021-10-31 DIAGNOSIS — Z79899 Other long term (current) drug therapy: Secondary | ICD-10-CM | POA: Diagnosis not present

## 2021-10-31 DIAGNOSIS — K635 Polyp of colon: Secondary | ICD-10-CM

## 2021-10-31 DIAGNOSIS — K5730 Diverticulosis of large intestine without perforation or abscess without bleeding: Secondary | ICD-10-CM | POA: Insufficient documentation

## 2021-10-31 HISTORY — DX: Presence of spectacles and contact lenses: Z97.3

## 2021-10-31 HISTORY — DX: Gastro-esophageal reflux disease without esophagitis: K21.9

## 2021-10-31 HISTORY — DX: Unspecified osteoarthritis, unspecified site: M19.90

## 2021-10-31 HISTORY — DX: Cardiac murmur, unspecified: R01.1

## 2021-10-31 HISTORY — PX: COLONOSCOPY WITH PROPOFOL: SHX5780

## 2021-10-31 SURGERY — COLONOSCOPY WITH PROPOFOL
Anesthesia: General | Site: Rectum

## 2021-10-31 MED ORDER — ACETAMINOPHEN 160 MG/5ML PO SOLN: 325.0000 mg | Freq: Once | ORAL | Status: DC

## 2021-10-31 MED ORDER — SODIUM CHLORIDE 0.9 % IV SOLN: INTRAVENOUS | Status: DC

## 2021-10-31 MED ORDER — STERILE WATER FOR IRRIGATION IR SOLN
Status: DC | PRN
  Administered 2021-10-31: 120 mL

## 2021-10-31 MED ORDER — LIDOCAINE HCL (CARDIAC) PF 100 MG/5ML IV SOSY
PREFILLED_SYRINGE | INTRAVENOUS | Status: DC | PRN
  Administered 2021-10-31: 30 mg via INTRAVENOUS

## 2021-10-31 MED ORDER — ACETAMINOPHEN 325 MG PO TABS: 325.0000 mg | ORAL_TABLET | Freq: Once | ORAL | Status: DC

## 2021-10-31 MED ORDER — PROPOFOL 10 MG/ML IV BOLUS
INTRAVENOUS | Status: DC | PRN
  Administered 2021-10-31 (×2): 50 mg via INTRAVENOUS
  Administered 2021-10-31: 40 mg via INTRAVENOUS
  Administered 2021-10-31: 150 mg via INTRAVENOUS
  Administered 2021-10-31: 40 mg via INTRAVENOUS

## 2021-10-31 MED ORDER — STERILE WATER FOR IRRIGATION IR SOLN
Status: DC | PRN
  Administered 2021-10-31: 150 mL

## 2021-10-31 MED ORDER — LACTATED RINGERS IV SOLN: INTRAVENOUS | Status: DC

## 2021-10-31 SURGICAL SUPPLY — 22 items

## 2021-11-03 ENCOUNTER — Encounter: Payer: Self-pay | Admitting: Gastroenterology

## 2021-11-03 LAB — SURGICAL PATHOLOGY

## 2021-11-06 ENCOUNTER — Encounter: Payer: Self-pay | Admitting: Gastroenterology
